# Patient Record
Sex: Female | Born: 1980
Health system: Southern US, Community
[De-identification: ages and names within clinical notes are randomized; demographics above are authoritative.]

## PROBLEM LIST (undated history)

## (undated) ENCOUNTER — Inpatient Hospital Stay (HOSPITAL_COMMUNITY): Payer: Self-pay

## (undated) DIAGNOSIS — B999 Unspecified infectious disease: Secondary | ICD-10-CM

## (undated) DIAGNOSIS — N390 Urinary tract infection, site not specified: Secondary | ICD-10-CM

## (undated) DIAGNOSIS — O1490 Unspecified pre-eclampsia, unspecified trimester: Secondary | ICD-10-CM

## (undated) HISTORY — PX: AUGMENTATION MAMMAPLASTY: SUR837

## (undated) HISTORY — PX: BREAST SURGERY: SHX581

## (undated) HISTORY — DX: Unspecified pre-eclampsia, unspecified trimester: O14.90

---

## 2004-06-28 ENCOUNTER — Ambulatory Visit (HOSPITAL_COMMUNITY): Admission: RE | Admit: 2004-06-28 | Discharge: 2004-06-28 | Payer: Self-pay | Admitting: *Deleted

## 2004-07-06 ENCOUNTER — Ambulatory Visit: Payer: Self-pay | Admitting: Family Medicine

## 2004-07-20 ENCOUNTER — Ambulatory Visit: Payer: Self-pay | Admitting: Family Medicine

## 2004-07-21 ENCOUNTER — Inpatient Hospital Stay (HOSPITAL_COMMUNITY): Admission: AD | Admit: 2004-07-21 | Discharge: 2004-07-21 | Payer: Self-pay | Admitting: *Deleted

## 2004-07-21 ENCOUNTER — Ambulatory Visit: Payer: Self-pay | Admitting: Certified Nurse Midwife

## 2004-07-26 ENCOUNTER — Ambulatory Visit: Payer: Self-pay | Admitting: *Deleted

## 2004-08-10 ENCOUNTER — Ambulatory Visit: Payer: Self-pay | Admitting: *Deleted

## 2004-08-24 ENCOUNTER — Ambulatory Visit: Payer: Self-pay | Admitting: *Deleted

## 2004-08-31 ENCOUNTER — Ambulatory Visit: Payer: Self-pay | Admitting: Family Medicine

## 2004-09-07 ENCOUNTER — Ambulatory Visit: Payer: Self-pay | Admitting: Family Medicine

## 2004-09-16 ENCOUNTER — Inpatient Hospital Stay (HOSPITAL_COMMUNITY): Admission: AD | Admit: 2004-09-16 | Discharge: 2004-09-18 | Payer: Self-pay | Admitting: Obstetrics & Gynecology

## 2004-09-16 ENCOUNTER — Ambulatory Visit: Payer: Self-pay | Admitting: Obstetrics and Gynecology

## 2005-03-09 ENCOUNTER — Emergency Department (HOSPITAL_COMMUNITY): Admission: EM | Admit: 2005-03-09 | Discharge: 2005-03-09 | Payer: Self-pay | Admitting: Family Medicine

## 2005-04-06 ENCOUNTER — Emergency Department (HOSPITAL_COMMUNITY): Admission: EM | Admit: 2005-04-06 | Discharge: 2005-04-06 | Payer: Self-pay | Admitting: Family Medicine

## 2005-04-23 ENCOUNTER — Emergency Department (HOSPITAL_COMMUNITY): Admission: EM | Admit: 2005-04-23 | Discharge: 2005-04-23 | Payer: Self-pay | Admitting: Family Medicine

## 2006-01-03 ENCOUNTER — Emergency Department (HOSPITAL_COMMUNITY): Admission: EM | Admit: 2006-01-03 | Discharge: 2006-01-04 | Payer: Self-pay | Admitting: Emergency Medicine

## 2006-02-10 ENCOUNTER — Emergency Department (HOSPITAL_COMMUNITY): Admission: EM | Admit: 2006-02-10 | Discharge: 2006-02-10 | Payer: Self-pay | Admitting: Emergency Medicine

## 2006-09-07 ENCOUNTER — Emergency Department (HOSPITAL_COMMUNITY): Admission: EM | Admit: 2006-09-07 | Discharge: 2006-09-08 | Payer: Self-pay | Admitting: Emergency Medicine

## 2006-12-10 ENCOUNTER — Encounter (INDEPENDENT_AMBULATORY_CARE_PROVIDER_SITE_OTHER): Payer: Self-pay | Admitting: *Deleted

## 2006-12-10 ENCOUNTER — Emergency Department (HOSPITAL_COMMUNITY): Admission: EM | Admit: 2006-12-10 | Discharge: 2006-12-10 | Payer: Self-pay | Admitting: Family Medicine

## 2009-02-08 ENCOUNTER — Emergency Department (HOSPITAL_COMMUNITY): Admission: EM | Admit: 2009-02-08 | Discharge: 2009-02-08 | Payer: Self-pay | Admitting: Emergency Medicine

## 2010-01-22 ENCOUNTER — Encounter (INDEPENDENT_AMBULATORY_CARE_PROVIDER_SITE_OTHER): Payer: Self-pay | Admitting: *Deleted

## 2010-01-22 ENCOUNTER — Emergency Department (HOSPITAL_COMMUNITY): Admission: EM | Admit: 2010-01-22 | Discharge: 2010-01-22 | Payer: Self-pay | Admitting: Emergency Medicine

## 2010-03-14 ENCOUNTER — Encounter: Payer: Self-pay | Admitting: Internal Medicine

## 2010-03-28 DIAGNOSIS — N83209 Unspecified ovarian cyst, unspecified side: Secondary | ICD-10-CM | POA: Insufficient documentation

## 2010-03-28 DIAGNOSIS — N946 Dysmenorrhea, unspecified: Secondary | ICD-10-CM | POA: Insufficient documentation

## 2010-03-28 DIAGNOSIS — D649 Anemia, unspecified: Secondary | ICD-10-CM

## 2010-03-28 DIAGNOSIS — K7689 Other specified diseases of liver: Secondary | ICD-10-CM | POA: Insufficient documentation

## 2010-03-31 ENCOUNTER — Ambulatory Visit: Payer: Self-pay | Admitting: Internal Medicine

## 2010-05-04 NOTE — Letter (Signed)
Summary: New Patient letter  [facility]  [address] [place], [place] [address]   Phone: [phone]  Fax: [phone]       03/14/2010 MRN: [id number]  [facility] [address] [place], [place]  [address]  Dear Ms. BUSH,  Welcome to the Gastroenterology Division at [facility].    You are scheduled to see Dr.  Leone Payor on 03-31-10 at 2:45pm on the 3rd floor at [facility], [address].  We ask that you try to arrive at our office 15 minutes prior to your appointment time to allow for check-in.  We would like you to complete the enclosed self-administered evaluation form prior to your visit and bring it with you on the day of your appointment.  We will review it with you.  Also, please bring a complete list of all your medications or, if you prefer, bring the medication bottles and we will list them.  Please bring your insurance card so that we may make a copy of it.  If your insurance requires a referral to see a specialist, please bring your referral form from your primary care physician.  Co-payments are due at the time of your visit and may be paid by cash, check or credit card.     Your office visit will consist of a consult with your physician (includes a physical exam), any laboratory testing he/she may order, scheduling of any necessary diagnostic testing (e.g. x-ray, ultrasound, CT-scan), and scheduling of a procedure (e.g. Endoscopy, Colonoscopy) if required.  Please allow enough time on your schedule to allow for any/all of these possibilities.    If you cannot keep your appointment, please call [phone] to cancel or reschedule prior to your appointment date.  This allows [place] the opportunity to schedule an appointment for another patient in need of care.  If you do not cancel or reschedule by 5 p.m. the business day prior to your appointment date, you will be charged a $50.00 late cancellation/no-show fee.    Thank you for choosing  [facility] for your medical needs.  We appreciate the opportunity to care for you.  Please visit [place] at our website  to learn more about our practice.                     Sincerely,                                                             The Gastroenterology Division

## 2010-06-01 HISTORY — PX: BREAST ENHANCEMENT SURGERY: SHX7

## 2010-06-14 LAB — COMPREHENSIVE METABOLIC PANEL
BUN: 8 mg/dL (ref 6–23)
Calcium: 9.4 mg/dL (ref 8.4–10.5)
Glucose, Bld: 89 mg/dL (ref 70–99)
Total Protein: 7.9 g/dL (ref 6.0–8.3)

## 2010-06-14 LAB — DIFFERENTIAL
Lymphocytes Relative: 28 % (ref 12–46)
Lymphs Abs: 1.8 10*3/uL (ref 0.7–4.0)
Monocytes Relative: 5 % (ref 3–12)
Neutro Abs: 4.3 10*3/uL (ref 1.7–7.7)
Neutrophils Relative %: 65 % (ref 43–77)

## 2010-06-14 LAB — CBC
HCT: 39 % (ref 36.0–46.0)
MCHC: 34.1 g/dL (ref 30.0–36.0)
MCV: 81.6 fL (ref 78.0–100.0)
RDW: 15 % (ref 11.5–15.5)

## 2010-06-14 LAB — URINALYSIS, ROUTINE W REFLEX MICROSCOPIC
Glucose, UA: NEGATIVE mg/dL
Ketones, ur: NEGATIVE mg/dL
Protein, ur: NEGATIVE mg/dL
Urobilinogen, UA: 0.2 mg/dL (ref 0.0–1.0)

## 2010-07-05 LAB — POCT PREGNANCY, URINE: Preg Test, Ur: NEGATIVE

## 2010-07-05 LAB — D-DIMER, QUANTITATIVE: D-Dimer, Quant: 1.56 ug/mL-FEU — ABNORMAL HIGH (ref 0.00–0.48)

## 2010-08-26 ENCOUNTER — Inpatient Hospital Stay (INDEPENDENT_AMBULATORY_CARE_PROVIDER_SITE_OTHER)
Admission: RE | Admit: 2010-08-26 | Discharge: 2010-08-26 | Disposition: A | Discharge status: Home / Self Care | Payer: Self-pay | Source: Ambulatory Visit | Attending: Family Medicine | Admitting: Family Medicine

## 2010-08-26 DIAGNOSIS — Z0489 Encounter for examination and observation for other specified reasons: Secondary | ICD-10-CM

## 2011-01-12 LAB — POCT URINALYSIS DIP (DEVICE)
Operator id: 235561
Protein, ur: NEGATIVE
Specific Gravity, Urine: 1.015
Urobilinogen, UA: 1

## 2011-01-12 LAB — POCT PREGNANCY, URINE: Operator id: 235561

## 2011-01-18 LAB — DIFFERENTIAL
Basophils Absolute: 0
Eosinophils Absolute: 0.2
Eosinophils Relative: 2
Lymphocytes Relative: 36
Lymphs Abs: 2.7
Monocytes Absolute: 0.3

## 2011-01-18 LAB — URINALYSIS, ROUTINE W REFLEX MICROSCOPIC
Ketones, ur: 40 — AB
Nitrite: NEGATIVE
pH: 6

## 2011-01-18 LAB — HEPATIC FUNCTION PANEL
AST: 19
Albumin: 3 — ABNORMAL LOW
Total Bilirubin: 1.4 — ABNORMAL HIGH
Total Protein: 5.8 — ABNORMAL LOW

## 2011-01-18 LAB — BASIC METABOLIC PANEL
BUN: 4 — ABNORMAL LOW
Chloride: 104
GFR calc non Af Amer: >60
Glucose, Bld: 87
Potassium: 3.2 — ABNORMAL LOW
Sodium: 136

## 2011-01-18 LAB — CBC
HCT: 34.8 — ABNORMAL LOW
Hemoglobin: 12.2
MCV: 83.2
RDW: 13.7

## 2011-01-18 LAB — URINE MICROSCOPIC-ADD ON

## 2011-01-18 LAB — HCG, QUANTITATIVE, PREGNANCY: hCG, Beta Chain, Quant, S: 47663 — ABNORMAL HIGH

## 2011-01-18 LAB — PREGNANCY, URINE: Preg Test, Ur: POSITIVE

## 2011-02-11 ENCOUNTER — Encounter: Payer: Self-pay | Admitting: Emergency Medicine

## 2011-02-11 ENCOUNTER — Emergency Department (HOSPITAL_COMMUNITY)
Admission: EM | Admit: 2011-02-11 | Discharge: 2011-02-11 | Discharge status: Against Medical Advice | Payer: Medicaid Other | Attending: Emergency Medicine | Admitting: Emergency Medicine

## 2011-02-11 DIAGNOSIS — R0602 Shortness of breath: Secondary | ICD-10-CM | POA: Insufficient documentation

## 2011-02-11 NOTE — ED Notes (Signed)
Pt reports intermittant periods of shortness of breath since Wednesday. Pt also reports abdominal pain onset last night. Pt has a child at home that has a virus.

## 2011-02-13 ENCOUNTER — Inpatient Hospital Stay (HOSPITAL_COMMUNITY)
Admission: AD | Admit: 2011-02-13 | Discharge: 2011-02-13 | Disposition: A | Discharge status: Home / Self Care | Payer: Medicaid Other | Source: Ambulatory Visit | Attending: Obstetrics & Gynecology | Admitting: Obstetrics & Gynecology

## 2011-02-13 ENCOUNTER — Encounter (HOSPITAL_COMMUNITY): Payer: Self-pay

## 2011-02-13 DIAGNOSIS — O99891 Other specified diseases and conditions complicating pregnancy: Secondary | ICD-10-CM | POA: Insufficient documentation

## 2011-02-13 DIAGNOSIS — K219 Gastro-esophageal reflux disease without esophagitis: Secondary | ICD-10-CM | POA: Insufficient documentation

## 2011-02-13 DIAGNOSIS — R109 Unspecified abdominal pain: Secondary | ICD-10-CM | POA: Insufficient documentation

## 2011-02-13 DIAGNOSIS — B373 Candidiasis of vulva and vagina: Secondary | ICD-10-CM

## 2011-02-13 LAB — URINALYSIS, ROUTINE W REFLEX MICROSCOPIC
Bilirubin Urine: NEGATIVE
Glucose, UA: NEGATIVE mg/dL
Ketones, ur: NEGATIVE mg/dL
Protein, ur: NEGATIVE mg/dL
pH: 6 (ref 5.0–8.0)

## 2011-02-13 LAB — URINE MICROSCOPIC-ADD ON

## 2011-02-13 MED ORDER — RANITIDINE HCL 150 MG PO TABS: 150.0000 mg | ORAL_TABLET | Freq: Two times a day (BID) | ORAL | Status: DC

## 2011-02-13 MED ORDER — FLUCONAZOLE 150 MG PO TABS: 150.0000 mg | ORAL_TABLET | Freq: Once | ORAL | Status: AC

## 2011-02-13 MED ORDER — GI COCKTAIL ~~LOC~~
30.0000 mL | Freq: Once | ORAL | Status: AC
Start: 2011-02-13 — End: 2011-02-13
  Administered 2011-02-13: 30 mL via ORAL
  Filled 2011-02-13: qty 30

## 2011-02-13 NOTE — Progress Notes (Signed)
Patient states she has not been feeling well for a while. Has had nausea and vomiting. Has tightness in chest periodically the causes shortness of breath. Slight vaginal discharge with irritation.

## 2011-02-13 NOTE — ED Provider Notes (Signed)
History   Pt presents today c/o lower abd pain along with N&V and heartburn. She states the heartburn causes her to have SOB and chest pain. She denies any sx at this time. She denies vag bleeding or any other sx.  Chief Complaint  Patient presents with  . Emesis   HPI  OB History    Grav Para Term Preterm Abortions TAB SAB Ect Mult Living   4 3  3      3       Past Medical History  Diagnosis Date  . No pertinent past medical history     Past Surgical History  Procedure Date  . Augmentation mammaplasty     No family history on file.  History  Substance Use Topics  . Smoking status: Never Smoker   . Smokeless tobacco: Not on file  . Alcohol Use: No    Allergies: No Known Allergies  Prescriptions prior to admission  Medication Sig Dispense Refill  . acetaminophen (TYLENOL) 500 MG tablet Take 1,000 mg by mouth every 6 (six) hours as needed. For pain       . magnesium hydroxide (MILK OF MAGNESIA) 400 MG/5ML suspension Take 15 mLs by mouth daily as needed. Stomach acid       . Prenatal Vit-Fe Fumarate-FA (MULTIVITAMIN-PRENATAL) 27-0.8 MG TABS Take 1 tablet by mouth daily.          Review of Systems  Constitutional: Negative for fever.  Respiratory: Negative for cough.   Cardiovascular: Positive for chest pain. Negative for palpitations.  Gastrointestinal: Positive for heartburn, nausea, vomiting and abdominal pain. Negative for diarrhea and constipation.  Genitourinary: Negative for dysuria, urgency, frequency and hematuria.  Neurological: Negative for dizziness and headaches.  Psychiatric/Behavioral: Negative for depression and suicidal ideas.   Physical Exam   Blood pressure 120/69, pulse 89, temperature 98.3 F (36.8 C), temperature source Oral, resp. rate 16, height 6' (1.829 m), weight 145 lb 6.4 oz (65.953 kg), last menstrual period 12/10/2010, SpO2 99.00%.  Physical Exam  Nursing note and vitals reviewed. Constitutional: She is oriented to person, place,  and time. She appears well-developed and well-nourished. No distress.  HENT:  Head: Normocephalic and atraumatic.  Eyes: EOM are normal. Pupils are equal, round, and reactive to light.  Cardiovascular: Normal rate, regular rhythm and normal heart sounds.  Exam reveals no gallop and no friction rub.   No murmur heard. Respiratory: Effort normal and breath sounds normal. No respiratory distress. She has no wheezes. She has no rales. She exhibits no tenderness.  GI: Soft. She exhibits no distension. There is no tenderness. There is no rebound and no guarding.  Genitourinary: No bleeding around the vagina. Vaginal discharge found.       Thick greenish vag dc present consistent with yeast. Cervix Lg/closed.  Neurological: She is alert and oriented to person, place, and time.  Skin: Skin is warm and dry. She is not diaphoretic.  Psychiatric: She has a normal mood and affect. Her behavior is normal. Judgment and thought content normal.    MAU Course  Procedures  Bedside US shows single IUP with cardiac activity.  Results for orders placed during the hospital encounter of 02/13/11 (from the past 24 hour(s))  URINALYSIS, ROUTINE W REFLEX MICROSCOPIC     Status: Abnormal   Collection Time   02/13/11  3:21 PM      Component Value Range   Color, Urine YELLOW  YELLOW    Appearance CLEAR  CLEAR    Specific Gravity, Urine  1.025  1.005 - 1.030    pH 6.0  5.0 - 8.0    Glucose, UA NEGATIVE  NEGATIVE (mg/dL)   Hgb urine dipstick SMALL (*) NEGATIVE    Bilirubin Urine NEGATIVE  NEGATIVE    Ketones, ur NEGATIVE  NEGATIVE (mg/dL)   Protein, ur NEGATIVE  NEGATIVE (mg/dL)   Urobilinogen, UA 0.2  0.0 - 1.0 (mg/dL)   Nitrite NEGATIVE  NEGATIVE    Leukocytes, UA TRACE (*) NEGATIVE   URINE MICROSCOPIC-ADD ON     Status: Abnormal   Collection Time   02/13/11  3:21 PM      Component Value Range   Squamous Epithelial / LPF FEW (*) RARE    WBC, UA 0-2  <3 (WBC/hpf)   Bacteria, UA FEW (*) RARE     Urine-Other MUCOUS PRESENT    POCT PREGNANCY, URINE     Status: Normal   Collection Time   02/13/11  3:27 PM      Component Value Range   Preg Test, Ur POSITIVE      Pt sx resolved following GI cocktail. Assessment and Plan  GERD: discussed with pt at length. Will give Rx for Zantac.  Abd pain: pt with single IUP. She will f/u with Dr. Tamela Oddi.  Clinton Gallant. Julius Boniface III, DrHSc, MPAS, PA-C  02/13/2011, 5:29 PM   Henrietta Hoover, PA 02/13/11 1736

## 2011-03-20 LAB — OB RESULTS CONSOLE HIV ANTIBODY (ROUTINE TESTING): HIV: NONREACTIVE

## 2011-03-20 LAB — OB RESULTS CONSOLE HEPATITIS B SURFACE ANTIGEN: Hepatitis B Surface Ag: NEGATIVE

## 2011-03-20 LAB — OB RESULTS CONSOLE RPR: RPR: NONREACTIVE

## 2011-03-20 LAB — OB RESULTS CONSOLE ABO/RH: RH Type: POSITIVE

## 2011-04-03 NOTE — L&D Delivery Note (Signed)
Delivery Note At 9:46 PM a viable female was delivered via  (Presentation: vertex ;  ).  APGAR: 9-10 , ; weight .   Placenta status: intact , .  Cord: 3 vessel  with the following complications: none .  Cord pH: none  Anesthesia: Epidural  Episiotomy: none Lacerations: none  Suture Repair: none Est. Blood Loss (mL): 350  Mom to postpartum.  Baby to nursery-stable.  Allison Deshotels A 08/31/2011, 10:01 PM

## 2011-06-02 ENCOUNTER — Inpatient Hospital Stay (HOSPITAL_COMMUNITY)
Admission: AD | Admit: 2011-06-02 | Discharge: 2011-06-03 | Disposition: A | Discharge status: Home / Self Care | Payer: Medicaid Other | Source: Ambulatory Visit | Attending: Obstetrics & Gynecology | Admitting: Obstetrics & Gynecology

## 2011-06-02 ENCOUNTER — Encounter (HOSPITAL_COMMUNITY): Payer: Self-pay | Admitting: *Deleted

## 2011-06-02 DIAGNOSIS — N949 Unspecified condition associated with female genital organs and menstrual cycle: Secondary | ICD-10-CM

## 2011-06-02 DIAGNOSIS — O99891 Other specified diseases and conditions complicating pregnancy: Secondary | ICD-10-CM | POA: Insufficient documentation

## 2011-06-02 DIAGNOSIS — R109 Unspecified abdominal pain: Secondary | ICD-10-CM | POA: Insufficient documentation

## 2011-06-02 LAB — URINALYSIS, ROUTINE W REFLEX MICROSCOPIC
Leukocytes, UA: NEGATIVE
Nitrite: NEGATIVE
Protein, ur: NEGATIVE mg/dL
Urobilinogen, UA: 0.2 mg/dL (ref 0.0–1.0)

## 2011-06-02 NOTE — ED Notes (Signed)
Spoke with Domenick Bookbinder RN, MSN, [facility] about pt's concern regarding hx MRSA on chart.Norwood Levo will come and talk with patient

## 2011-06-02 NOTE — Progress Notes (Signed)
Pt states, " This all started with cramping in my right butt cheek about three hours ago and it moved into my rectal area. It feels like my right leg is numb in my thigh. About an hour and half after the butt pain I started having low abdominal cramping,"

## 2011-06-02 NOTE — ED Notes (Signed)
Threasa Heads CNM at bedside.

## 2011-06-02 NOTE — ED Notes (Signed)
Pt very upset that chart shows pt hx MRSA and pt unaware. Explained must be isolated when hx MRSA on chart and coming into MAU

## 2011-06-02 NOTE — ED Provider Notes (Signed)
History     Chief Complaint  Patient presents with  . Abdominal Cramping   HPI  Pt states, " This all started with cramping in my right butt cheek about three hours ago and it moved into my rectal area. It feels like my right leg is numb in my thigh. About an hour and half after the butt pain I started having low abdominal cramping,"  Denies vaginal bleeding or leaking of fluid.     Past Medical History  Diagnosis Date  . No pertinent past medical history     Past Surgical History  Procedure Date  . Augmentation mammaplasty   . Breast enhancement surgery 06/2010    Family History  Problem Relation Age of Onset  . Anesthesia problems Neg Hx   . Hypotension Neg Hx   . Malignant hyperthermia Neg Hx   . Pseudochol deficiency Neg Hx     History  Substance Use Topics  . Smoking status: Never Smoker   . Smokeless tobacco: Not on file  . Alcohol Use: No    Allergies: No Known Allergies  Prescriptions prior to admission  Medication Sig Dispense Refill  . butalbital-acetaminophen-caffeine (FIORICET) 50-325-40 MG per tablet Take 1 tablet by mouth 2 (two) times daily as needed.      . Prenatal Vit-Fe Fumarate-FA (MULTIVITAMIN-PRENATAL) 27-0.8 MG TABS Take 1 tablet by mouth daily.        . ranitidine (ZANTAC) 150 MG tablet Take 1 tablet (150 mg total) by mouth 2 (two) times daily.  60 tablet  6    Review of Systems  Genitourinary:       Vaginal pain  Musculoskeletal:       Right buttocks pain  All other systems reviewed and are negative.   Physical Exam   Blood pressure 103/60, pulse 78, temperature 97 F (36.1 C), temperature source Oral, resp. rate 20, height 6\' 1"  (1.854 m), weight 73.029 kg (161 lb), last menstrual period 12/10/2010.  Physical Exam  Constitutional: She is oriented to person, place, and time. She appears well-developed and well-nourished. No distress.  HENT:  Head: Normocephalic.  Neck: Normal range of motion. Neck supple.  Cardiovascular: Normal  rate, regular rhythm and normal heart sounds.   Respiratory: Effort normal and breath sounds normal.  GI: Soft. There is no tenderness.  Genitourinary: No bleeding around the vagina. Vaginal discharge (mucusy) found.       Cervix - closed  Neurological: She is alert and oriented to person, place, and time.  Skin: Skin is warm and dry.   FHR 130's Toco - irritability (no contractions felt by pt) MAU Course  Procedures  Results for orders placed during the hospital encounter of 06/02/11 (from the past 24 hour(s))  URINALYSIS, ROUTINE W REFLEX MICROSCOPIC     Status: Normal   Collection Time   06/02/11 11:07 PM      Component Value Range   Color, Urine YELLOW  YELLOW    APPearance CLEAR  CLEAR    Specific Gravity, Urine 1.020  1.005 - 1.030    pH 6.5  5.0 - 8.0    Glucose, UA NEGATIVE  NEGATIVE (mg/dL)   Hgb urine dipstick NEGATIVE  NEGATIVE    Bilirubin Urine NEGATIVE  NEGATIVE    Ketones, ur NEGATIVE  NEGATIVE (mg/dL)   Protein, ur NEGATIVE  NEGATIVE (mg/dL)   Urobilinogen, UA 0.2  0.0 - 1.0 (mg/dL)   Nitrite NEGATIVE  NEGATIVE    Leukocytes, UA NEGATIVE  NEGATIVE  Assessment and Plan  Round Ligament Pain  Plan: DC to home Reviewed preterm labor Keep scheduled appointment  [facility] 06/02/2011, 11:35 PM

## 2011-06-03 NOTE — ED Notes (Signed)
Lab called and unable to obtain any lab results to indicate pos MRSA

## 2011-06-03 NOTE — ED Notes (Signed)
Domenick Bookbinder RN MSN [facility] in to talk with pt regarding MRSA

## 2011-06-08 ENCOUNTER — Other Ambulatory Visit: Payer: Self-pay | Admitting: Obstetrics & Gynecology

## 2011-06-08 DIAGNOSIS — Z0489 Encounter for examination and observation for other specified reasons: Secondary | ICD-10-CM

## 2011-06-14 ENCOUNTER — Ambulatory Visit (HOSPITAL_COMMUNITY)
Admission: RE | Admit: 2011-06-14 | Discharge: 2011-06-14 | Disposition: A | Discharge status: Home / Self Care | Payer: Medicaid Other | Source: Ambulatory Visit | Attending: Obstetrics & Gynecology | Admitting: Obstetrics & Gynecology

## 2011-06-14 DIAGNOSIS — Z8751 Personal history of pre-term labor: Secondary | ICD-10-CM | POA: Insufficient documentation

## 2011-06-14 DIAGNOSIS — Z1389 Encounter for screening for other disorder: Secondary | ICD-10-CM | POA: Insufficient documentation

## 2011-06-14 DIAGNOSIS — Z0489 Encounter for examination and observation for other specified reasons: Secondary | ICD-10-CM

## 2011-06-14 DIAGNOSIS — O358XX0 Maternal care for other (suspected) fetal abnormality and damage, not applicable or unspecified: Secondary | ICD-10-CM | POA: Insufficient documentation

## 2011-06-14 DIAGNOSIS — Z363 Encounter for antenatal screening for malformations: Secondary | ICD-10-CM | POA: Insufficient documentation

## 2011-07-25 ENCOUNTER — Other Ambulatory Visit: Payer: Self-pay | Admitting: Obstetrics & Gynecology

## 2011-07-25 DIAGNOSIS — O4100X0 Oligohydramnios, unspecified trimester, not applicable or unspecified: Secondary | ICD-10-CM

## 2011-07-27 ENCOUNTER — Ambulatory Visit (HOSPITAL_COMMUNITY)
Admission: RE | Admit: 2011-07-27 | Discharge: 2011-07-27 | Disposition: A | Discharge status: Home / Self Care | Payer: Medicaid Other | Source: Ambulatory Visit | Attending: Obstetrics & Gynecology | Admitting: Obstetrics & Gynecology

## 2011-07-27 DIAGNOSIS — Z3689 Encounter for other specified antenatal screening: Secondary | ICD-10-CM | POA: Insufficient documentation

## 2011-07-27 DIAGNOSIS — O4100X0 Oligohydramnios, unspecified trimester, not applicable or unspecified: Secondary | ICD-10-CM

## 2011-08-03 ENCOUNTER — Encounter (HOSPITAL_COMMUNITY): Payer: Self-pay | Admitting: *Deleted

## 2011-08-03 ENCOUNTER — Inpatient Hospital Stay (HOSPITAL_COMMUNITY)
Admission: AD | Admit: 2011-08-03 | Discharge: 2011-08-03 | Disposition: A | Discharge status: Home / Self Care | Payer: Medicaid Other | Source: Ambulatory Visit | Attending: Obstetrics & Gynecology | Admitting: Obstetrics & Gynecology

## 2011-08-03 DIAGNOSIS — O4700 False labor before 37 completed weeks of gestation, unspecified trimester: Secondary | ICD-10-CM | POA: Insufficient documentation

## 2011-08-03 HISTORY — DX: Urinary tract infection, site not specified: N39.0

## 2011-08-03 LAB — WET PREP, GENITAL: Yeast Wet Prep HPF POC: NONE SEEN

## 2011-08-03 LAB — URINALYSIS, ROUTINE W REFLEX MICROSCOPIC
Bilirubin Urine: NEGATIVE
Glucose, UA: NEGATIVE mg/dL
Hgb urine dipstick: NEGATIVE
Nitrite: NEGATIVE
Specific Gravity, Urine: 1.01 (ref 1.005–1.030)
pH: 7.5 (ref 5.0–8.0)

## 2011-08-03 LAB — FETAL FIBRONECTIN: Fetal Fibronectin: NEGATIVE

## 2011-08-03 MED ORDER — NIFEDIPINE ER OSMOTIC RELEASE 60 MG PO TB24: 60.0000 mg | ORAL_TABLET | Freq: Every day | ORAL | Status: DC

## 2011-08-03 MED ORDER — FAMOTIDINE 20 MG PO TABS
20.0000 mg | ORAL_TABLET | Freq: Once | ORAL | Status: AC
  Administered 2011-08-03: 20 mg via ORAL
  Filled 2011-08-03: qty 1

## 2011-08-03 MED ORDER — TERBUTALINE SULFATE 1 MG/ML IJ SOLN
0.2500 mg | Freq: Once | INTRAMUSCULAR | Status: AC
  Administered 2011-08-03: 0.25 mg via SUBCUTANEOUS
  Filled 2011-08-03: qty 1

## 2011-08-03 NOTE — MAU Note (Signed)
No more ctx's, pain from heartburn gone.

## 2011-08-03 NOTE — MAU Provider Note (Signed)
History     CSN: [id number]  Arrival date and time: 08/03/11 1655   First Provider Initiated Contact with Patient 08/03/11 1757      Chief Complaint  Patient presents with  . Labor Eval   HPI 31 y.o. [id number] at [redacted]w[redacted]d with contractions x 6 hours today, "every few minutes" for a couple of hours, then stopped for a while, now coming "every few minutes" again. No bleeding or LOF. H/O PTD x 2 at 34 and 35 weeks. On 17-P this pregnancy. Cervix 1 cm since 28 weeks.   Past Medical History  Diagnosis Date  . Preterm labor   . Urinary tract infection     Past Surgical History  Procedure Date  . Augmentation mammaplasty   . Breast enhancement surgery 06/2010    Family History  Problem Relation Age of Onset  . Anesthesia problems Neg Hx   . Hypotension Neg Hx   . Malignant hyperthermia Neg Hx   . Pseudochol deficiency Neg Hx   . Hearing loss Son   . Hypertension Maternal Aunt   . Hearing loss Maternal Aunt   . Hypertension Maternal Uncle   . Hypertension Maternal Grandmother   . Hypertension Maternal Grandfather     History  Substance Use Topics  . Smoking status: Former Games developer  . Smokeless tobacco: Never Used   Comment: 2010  . Alcohol Use: No    Allergies: No Known Allergies  Prescriptions prior to admission  Medication Sig Dispense Refill  . butalbital-acetaminophen-caffeine (FIORICET) 50-325-40 MG per tablet Take 1 tablet by mouth 2 (two) times daily as needed. headaches      . calcium carbonate (OS-CAL) 600 MG TABS Take 600 mg by mouth daily.      . Prenatal Vit-Fe Fumarate-FA (MULTIVITAMIN-PRENATAL) 27-0.8 MG TABS Take 1 tablet by mouth daily.        . ranitidine (ZANTAC) 150 MG tablet Take 1 tablet (150 mg total) by mouth 2 (two) times daily.  60 tablet  6    Review of Systems  Constitutional: Negative.   Respiratory: Negative.   Cardiovascular: Negative.   Gastrointestinal: Negative for nausea, vomiting, abdominal pain, diarrhea and constipation.    Genitourinary: Negative for dysuria, urgency, frequency, hematuria and flank pain.       Negative for vaginal bleeding, Positive for cramping/contractions  Musculoskeletal: Negative.   Neurological: Negative.   Psychiatric/Behavioral: Negative.    Physical Exam   Blood pressure 109/71, pulse 83, temperature 98.1 F (36.7 C), temperature source Oral, resp. rate 20, last menstrual period 12/10/2010.  Physical Exam  Nursing note and vitals reviewed. Constitutional: She is oriented to person, place, and time. She appears well-developed and well-nourished. No distress.  Cardiovascular: Normal rate.   Respiratory: Effort normal.  GI: Soft. There is no tenderness.  Genitourinary: No vaginal discharge found.       SVE: 1.5/thick/high/posterior  Musculoskeletal: Normal range of motion.  Neurological: She is alert and oriented to person, place, and time.  Skin: Skin is warm and dry.  Psychiatric: She has a normal mood and affect.    MAU Course  Procedures  Results for orders placed during the hospital encounter of 08/03/11 (from the past 24 hour(s))  WET PREP, GENITAL     Status: Abnormal   Collection Time   08/03/11  6:08 PM      Component Value Range   Yeast Wet Prep HPF POC NONE SEEN  NONE SEEN    Trich, Wet Prep NONE SEEN  NONE SEEN  Clue Cells Wet Prep HPF POC NONE SEEN  NONE SEEN    WBC, Wet Prep HPF POC MODERATE (*) NONE SEEN   FETAL FIBRONECTIN     Status: Normal   Collection Time   08/03/11  6:08 PM      Component Value Range   Fetal Fibronectin NEGATIVE  NEGATIVE   URINALYSIS, ROUTINE W REFLEX MICROSCOPIC     Status: Normal   Collection Time   08/03/11  6:46 PM      Component Value Range   Color, Urine YELLOW  YELLOW    APPearance CLEAR  CLEAR    Specific Gravity, Urine 1.010  1.005 - 1.030    pH 7.5  5.0 - 8.0    Glucose, UA NEGATIVE  NEGATIVE (mg/dL)   Hgb urine dipstick NEGATIVE  NEGATIVE    Bilirubin Urine NEGATIVE  NEGATIVE    Ketones, ur NEGATIVE  NEGATIVE  (mg/dL)   Protein, ur NEGATIVE  NEGATIVE (mg/dL)   Urobilinogen, UA 0.2  0.0 - 1.0 (mg/dL)   Nitrite NEGATIVE  NEGATIVE    Leukocytes, UA NEGATIVE  NEGATIVE      Assessment and Plan  31 y.o. [id number] at [redacted]w[redacted]d Threatened preterm labor Contractions resolved with Terb 0.25 mg [place] Rx Procardia XL 60 mg PO daily F/U in office   Sanoe Hazan 08/03/2011, 6:06 PM

## 2011-08-03 NOTE — MAU Note (Signed)
consistant pains since noon, asking about medication to stop contractions. Has hx of preterm deliveries.

## 2011-08-09 ENCOUNTER — Other Ambulatory Visit: Payer: Self-pay | Admitting: Obstetrics & Gynecology

## 2011-08-09 DIAGNOSIS — O4100X0 Oligohydramnios, unspecified trimester, not applicable or unspecified: Secondary | ICD-10-CM

## 2011-08-10 ENCOUNTER — Ambulatory Visit (HOSPITAL_COMMUNITY)
Admission: RE | Admit: 2011-08-10 | Discharge: 2011-08-10 | Disposition: A | Discharge status: Home / Self Care | Payer: Medicaid Other | Source: Ambulatory Visit | Attending: Obstetrics & Gynecology | Admitting: Obstetrics & Gynecology

## 2011-08-10 DIAGNOSIS — O4100X0 Oligohydramnios, unspecified trimester, not applicable or unspecified: Secondary | ICD-10-CM | POA: Insufficient documentation

## 2011-08-10 DIAGNOSIS — Z3689 Encounter for other specified antenatal screening: Secondary | ICD-10-CM | POA: Insufficient documentation

## 2011-08-16 ENCOUNTER — Other Ambulatory Visit: Payer: Self-pay | Admitting: Obstetrics & Gynecology

## 2011-08-16 DIAGNOSIS — O288 Other abnormal findings on antenatal screening of mother: Secondary | ICD-10-CM

## 2011-08-17 ENCOUNTER — Other Ambulatory Visit: Payer: Self-pay | Admitting: Obstetrics & Gynecology

## 2011-08-17 ENCOUNTER — Ambulatory Visit (HOSPITAL_COMMUNITY)
Admission: RE | Admit: 2011-08-17 | Discharge: 2011-08-17 | Disposition: A | Discharge status: Home / Self Care | Payer: Medicaid Other | Source: Ambulatory Visit | Attending: Obstetrics & Gynecology | Admitting: Obstetrics & Gynecology

## 2011-08-17 DIAGNOSIS — O288 Other abnormal findings on antenatal screening of mother: Secondary | ICD-10-CM

## 2011-08-17 DIAGNOSIS — Z8751 Personal history of pre-term labor: Secondary | ICD-10-CM | POA: Insufficient documentation

## 2011-08-17 DIAGNOSIS — O4100X0 Oligohydramnios, unspecified trimester, not applicable or unspecified: Secondary | ICD-10-CM | POA: Insufficient documentation

## 2011-08-26 ENCOUNTER — Inpatient Hospital Stay (HOSPITAL_COMMUNITY)
Admission: AD | Admit: 2011-08-26 | Discharge: 2011-08-26 | Disposition: A | Discharge status: Home / Self Care | Payer: Medicaid Other | Source: Ambulatory Visit | Attending: Obstetrics & Gynecology | Admitting: Obstetrics & Gynecology

## 2011-08-26 DIAGNOSIS — O479 False labor, unspecified: Secondary | ICD-10-CM | POA: Insufficient documentation

## 2011-08-26 NOTE — MAU Note (Signed)
"  I've been contracting since last night about every 10 minutes.  They are more intense since this morning, so I just wanted to come get checked out.  (+) FM, no bleeding or leaking of fluid."

## 2011-08-31 ENCOUNTER — Encounter (HOSPITAL_COMMUNITY): Payer: Self-pay

## 2011-08-31 ENCOUNTER — Encounter (HOSPITAL_COMMUNITY): Payer: Self-pay | Admitting: Anesthesiology

## 2011-08-31 ENCOUNTER — Inpatient Hospital Stay (HOSPITAL_COMMUNITY)
Admission: AD | Admit: 2011-08-31 | Discharge: 2011-09-02 | DRG: 775 | Disposition: A | Discharge status: Home / Self Care | Payer: Medicaid Other | Source: Ambulatory Visit | Attending: Obstetrics | Admitting: Obstetrics

## 2011-08-31 ENCOUNTER — Inpatient Hospital Stay (HOSPITAL_COMMUNITY): Payer: Medicaid Other | Admitting: Anesthesiology

## 2011-08-31 ENCOUNTER — Encounter (HOSPITAL_COMMUNITY): Payer: Self-pay | Admitting: *Deleted

## 2011-08-31 LAB — CBC
HCT: 34.5 % — ABNORMAL LOW (ref 36.0–46.0)
Hemoglobin: 12 g/dL (ref 12.0–15.0)
MCV: 78.8 fL (ref 78.0–100.0)
RBC: 4.38 MIL/uL (ref 3.87–5.11)
WBC: 9.4 10*3/uL (ref 4.0–10.5)

## 2011-08-31 MED ORDER — IBUPROFEN 600 MG PO TABS: 600.0000 mg | ORAL_TABLET | Freq: Four times a day (QID) | ORAL | Status: DC | PRN

## 2011-08-31 MED ORDER — LACTATED RINGERS IV SOLN
500.0000 mL | Freq: Once | INTRAVENOUS | Status: AC
  Administered 2011-08-31: 500 mL via INTRAVENOUS

## 2011-08-31 MED ORDER — OXYTOCIN 20 UNITS IN LACTATED RINGERS INFUSION - SIMPLE
1.0000 m[IU]/min | INTRAVENOUS | Status: DC
  Administered 2011-08-31: 1 m[IU]/min via INTRAVENOUS
  Filled 2011-08-31: qty 1000

## 2011-08-31 MED ORDER — ONDANSETRON HCL 4 MG/2ML IJ SOLN: 4.0000 mg | Freq: Four times a day (QID) | INTRAMUSCULAR | Status: DC | PRN

## 2011-08-31 MED ORDER — OXYCODONE-ACETAMINOPHEN 5-325 MG PO TABS: 1.0000 | ORAL_TABLET | ORAL | Status: DC | PRN

## 2011-08-31 MED ORDER — EPHEDRINE 5 MG/ML INJ
10.0000 mg | INTRAVENOUS | Status: DC | PRN
  Filled 2011-08-31: qty 2

## 2011-08-31 MED ORDER — LIDOCAINE HCL (PF) 1 % IJ SOLN
30.0000 mL | INTRAMUSCULAR | Status: DC | PRN
  Filled 2011-08-31 (×2): qty 30

## 2011-08-31 MED ORDER — OXYCODONE-ACETAMINOPHEN 5-325 MG PO TABS
2.0000 | ORAL_TABLET | Freq: Once | ORAL | Status: AC
  Administered 2011-08-31: 2 via ORAL
  Filled 2011-08-31: qty 2

## 2011-08-31 MED ORDER — LACTATED RINGERS IV SOLN: 500.0000 mL | INTRAVENOUS | Status: DC | PRN

## 2011-08-31 MED ORDER — ACETAMINOPHEN 325 MG PO TABS: 650.0000 mg | ORAL_TABLET | ORAL | Status: DC | PRN

## 2011-08-31 MED ORDER — OXYTOCIN 10 UNIT/ML IJ SOLN
40.0000 [IU] | Freq: Once | INTRAVENOUS | Status: AC
  Administered 2011-08-31: 40 [IU] via INTRAVENOUS
  Filled 2011-08-31: qty 4

## 2011-08-31 MED ORDER — FLEET ENEMA 7-19 GM/118ML RE ENEM: 1.0000 | ENEMA | RECTAL | Status: DC | PRN

## 2011-08-31 MED ORDER — LACTATED RINGERS IV SOLN
INTRAVENOUS | Status: DC
  Administered 2011-08-31: 125 mL/h via INTRAVENOUS
  Administered 2011-08-31: 17:00:00 via INTRAVENOUS

## 2011-08-31 MED ORDER — EPHEDRINE 5 MG/ML INJ
10.0000 mg | INTRAVENOUS | Status: DC | PRN
  Filled 2011-08-31: qty 4
  Filled 2011-08-31: qty 2

## 2011-08-31 MED ORDER — OXYTOCIN BOLUS FROM INFUSION
500.0000 mL | Freq: Once | INTRAVENOUS | Status: DC
  Filled 2011-08-31: qty 500

## 2011-08-31 MED ORDER — FENTANYL 2.5 MCG/ML BUPIVACAINE 1/10 % EPIDURAL INFUSION (WH - ANES)
INTRAMUSCULAR | Status: DC | PRN
  Administered 2011-08-31: 14 mL/h via EPIDURAL

## 2011-08-31 MED ORDER — CITRIC ACID-SODIUM CITRATE 334-500 MG/5ML PO SOLN: 30.0000 mL | ORAL | Status: DC | PRN

## 2011-08-31 MED ORDER — PHENYLEPHRINE 40 MCG/ML (10ML) SYRINGE FOR IV PUSH (FOR BLOOD PRESSURE SUPPORT)
80.0000 ug | PREFILLED_SYRINGE | INTRAVENOUS | Status: DC | PRN
  Filled 2011-08-31: qty 2

## 2011-08-31 MED ORDER — DIPHENHYDRAMINE HCL 50 MG/ML IJ SOLN
12.5000 mg | INTRAMUSCULAR | Status: DC | PRN
  Administered 2011-08-31: 12.5 mg via INTRAVENOUS
  Filled 2011-08-31: qty 1

## 2011-08-31 MED ORDER — FENTANYL 2.5 MCG/ML BUPIVACAINE 1/10 % EPIDURAL INFUSION (WH - ANES)
14.0000 mL/h | INTRAMUSCULAR | Status: DC
  Administered 2011-08-31: 14 mL/h via EPIDURAL
  Filled 2011-08-31 (×2): qty 60

## 2011-08-31 MED ORDER — TERBUTALINE SULFATE 1 MG/ML IJ SOLN: 0.2500 mg | Freq: Once | INTRAMUSCULAR | Status: DC | PRN

## 2011-08-31 MED ORDER — OXYTOCIN 20 UNITS IN LACTATED RINGERS INFUSION - SIMPLE
125.0000 mL/h | Freq: Once | INTRAVENOUS | Status: AC
  Administered 2011-08-31: 125 mL/h via INTRAVENOUS

## 2011-08-31 MED ORDER — LIDOCAINE HCL (PF) 1 % IJ SOLN
INTRAMUSCULAR | Status: DC | PRN
  Administered 2011-08-31 (×2): 8 mL

## 2011-08-31 MED ORDER — PHENYLEPHRINE 40 MCG/ML (10ML) SYRINGE FOR IV PUSH (FOR BLOOD PRESSURE SUPPORT)
80.0000 ug | PREFILLED_SYRINGE | INTRAVENOUS | Status: DC | PRN
  Filled 2011-08-31: qty 5
  Filled 2011-08-31: qty 2

## 2011-08-31 NOTE — H&P (Signed)
Michelle [name] is a 31 y.o. female presenting for UC's. Maternal Medical History:  Reason for admission: Reason for admission: contractions.  Contractions: Onset was 6-12 hours ago.    Fetal activity: Perceived fetal activity is normal.   Last perceived fetal movement was within the past hour.    Prenatal complications: no prenatal complications Prenatal Complications - Diabetes: none.    OB History    Grav Para Term Preterm Abortions TAB SAB Ect Mult Living   4 3 1 2      3      Past Medical History  Diagnosis Date  . Preterm labor   . Urinary tract infection    Past Surgical History  Procedure Date  . Augmentation mammaplasty   . Breast enhancement surgery 06/2010   Family History: family history includes Hearing loss in her maternal aunt and son and Hypertension in her maternal aunt, maternal grandfather, maternal grandmother, and maternal uncle.  There is no history of Anesthesia problems, and Hypotension, and Malignant hyperthermia, and Pseudochol deficiency, . Social History:  reports that she has quit smoking. She has never used smokeless tobacco. She reports that she does not drink alcohol or use illicit drugs.  Review of Systems  All other systems reviewed and are negative.    Dilation: 5.5 Effacement (%): 80 Station: -2 Exam by:: SBeck, RN Blood pressure 107/88, pulse 81, temperature 98.3 F (36.8 C), temperature source Oral, resp. rate 16, height 6\' 1"  (1.854 m), weight 79.47 kg (175 lb 3.2 oz), last menstrual period 12/10/2010, SpO2 100.00%. Maternal Exam:  Abdomen: Patient reports no abdominal tenderness. Fetal presentation: vertex     Physical Exam  Nursing note and vitals reviewed. Constitutional: She is oriented to person, place, and time. She appears well-developed and well-nourished.  HENT:  Head: Normocephalic and atraumatic.  Eyes: Conjunctivae are normal. Pupils are equal, round, and reactive to light.  Neck: Normal range of motion. Neck  supple.  Cardiovascular: Normal rate and regular rhythm.   Respiratory: Effort normal.  GI: Soft.  Musculoskeletal: Normal range of motion.  Neurological: She is alert and oriented to person, place, and time.  Skin: Skin is warm and dry.  Psychiatric: She has a normal mood and affect. Her behavior is normal. Judgment and thought content normal.    Prenatal labs: ABO, Rh:   Antibody:   Rubella:   RPR:    HBsAg:    HIV:    GBS:     Assessment/Plan: 37 weeks.  Active labor.  Admit.   Aronda Burford A 08/31/2011, 4:15 PM

## 2011-08-31 NOTE — Anesthesia Procedure Notes (Signed)
Epidural Patient location during procedure: OB Start time: 08/31/2011 6:05 PM End time: 08/31/2011 6:12 PM Reason for block: procedure for pain  Staffing Anesthesiologist: Sandrea Hughs Performed by: anesthesiologist   Preanesthetic Checklist Completed: patient identified, site marked, surgical consent, pre-op evaluation, timeout performed, IV checked, risks and benefits discussed and monitors and equipment checked  Epidural Patient position: sitting Prep: site prepped and draped and DuraPrep Patient monitoring: continuous pulse ox and blood pressure Approach: midline Injection technique: LOR air  Needle:  Needle type: Tuohy  Needle gauge: 17 G Needle length: 9 cm Needle insertion depth: 5 cm cm Catheter type: closed end flexible Catheter size: 19 Gauge Catheter at skin depth: 10 cm Test dose: negative and Other  Assessment Sensory level: T10 Events: blood not aspirated, injection not painful, no injection resistance, negative IV test and no paresthesia

## 2011-08-31 NOTE — MAU Note (Signed)
Dr. Clearance Coots notified pt requests pain meds to go home with, cervix very posterior, 3.5/60/-1 vertex. EFM tracing reactive. Orders to d/c monitoring, give po percocet in MAU, then d/c home. Dr Clearance Coots to call in rx for vicodin to [facility] on [address].Marland Kitchen

## 2011-08-31 NOTE — Progress Notes (Signed)
Michelle [name] is a 31 y.o. [id number] at [redacted]w[redacted]d by LMP admitted for active labor  Subjective:   Objective: BP 112/66  Pulse 68  Temp(Src) 98.2 F (36.8 C) (Oral)  Resp 18  Ht 6\' 1"  (1.854 m)  Wt 79.47 kg (175 lb 3.2 oz)  BMI 23.11 kg/m2  SpO2 97%  LMP 12/10/2010      FHT:  FHR: 150 bpm, variability: moderate,  accelerations:  Present,  decelerations:  Absent UC:   regular, every 3-4 minutes SVE:   Dilation: 8 Effacement (%): 100 Station: 0 Exam by:: DuPont, RN   Labs: Lab Results  Component Value Date   WBC 9.4 08/31/2011   HGB 12.0 08/31/2011   HCT 34.5* 08/31/2011   MCV 78.8 08/31/2011   PLT 208 08/31/2011    Assessment / Plan: Spontaneous labor, progressing normally  Labor: Progressing normally Preeclampsia:  n/a Fetal Wellbeing:  Category I Pain Control:  Epidural I/D:  n/a Anticipated MOD:  NSVD  Proctor Carriker A 08/31/2011, 8:30 PM

## 2011-08-31 NOTE — Discharge Instructions (Signed)
Follow up as directed by your doctor. Your prescription for pain medication has been called in to the [facility] off [address].      Fetal Movement Michelle [name] Patient Name: __________________________________________________ Patient Due Date: ____________________ Michelle Michelle [name] is highly recommended in high risk pregnancies, but it is a good idea for every pregnant woman to do. Start counting fetal movements at 28 weeks of the pregnancy. Fetal movements increase after eating a full meal or eating or drinking something sweet (the blood sugar is higher). It is also important to drink plenty of fluids (well hydrated) before doing the count. Lie on your left side because it helps with the circulation or you can sit in a comfortable chair with your arms over your belly (abdomen) with no distractions around you. DOING THE COUNT  Try to do the count the same time of day each time you do it.   Mark the day and time, then see how long it takes for you to feel 10 movements (kicks, flutters, swishes, rolls). You should have at least 10 movements within 2 hours. You will most likely feel 10 movements in much less than 2 hours. If you do not, wait an hour and count again. After a couple of days you will see a pattern.   What you are looking for is a change in the pattern or not enough Michelle [name] in 2 hours. Is it taking longer in time to reach 10 movements?  SEEK MEDICAL CARE IF:  You feel less than 10 Michelle [name] in 2 hours. Tried twice.   No movement in one hour.   The pattern is changing or taking longer each day to reach 10 Michelle [name] in 2 hours.   You feel the baby is not moving as it usually does.  Date: ____________ Movements: ____________ Start time: ____________ Michelle Michelle [name] time: ____________  Date: ____________ Movements: ____________ Start time: ____________ Michelle Michelle [name] time: ____________ Date: ____________ Movements: ____________ Start time: ____________ Michelle Michelle [name] time: ____________ Date: ____________ Movements: ____________  Start time: ____________ Michelle Michelle [name] time: ____________ Date: ____________ Movements: ____________ Start time: ____________ Michelle Michelle [name] time: ____________ Date: ____________ Movements: ____________ Start time: ____________ Michelle Michelle [name] time: ____________ Date: ____________ Movements: ____________ Start time: ____________ Michelle Michelle [name] time: ____________ Date: ____________ Movements: ____________ Start time: ____________ Michelle Michelle [name] time: ____________  Date: ____________ Movements: ____________ Start time: ____________ Michelle Michelle [name] time: ____________ Date: ____________ Movements: ____________ Start time: ____________ Michelle Michelle [name] time: ____________ Date: ____________ Movements: ____________ Start time: ____________ Michelle Michelle [name] time: ____________ Date: ____________ Movements: ____________ Start time: ____________ Michelle Michelle [name] time: ____________ Date: ____________ Movements: ____________ Start time: ____________ Michelle Michelle [name] time: ____________ Date: ____________ Movements: ____________ Start time: ____________ Michelle Michelle [name] time: ____________ Date: ____________ Movements: ____________ Start time: ____________ Michelle Michelle [name] time: ____________  Date: ____________ Movements: ____________ Start time: ____________ Michelle Michelle [name] time: ____________ Date: ____________ Movements: ____________ Start time: ____________ Michelle Michelle [name] time: ____________ Date: ____________ Movements: ____________ Start time: ____________ Michelle Michelle [name] time: ____________ Date: ____________ Movements: ____________ Start time: ____________ Michelle Michelle [name] time: ____________ Date: ____________ Movements: ____________ Start time: ____________ Michelle Michelle [name] time: ____________ Date: ____________ Movements: ____________ Start time: ____________ Michelle Michelle [name] time: ____________ Date: ____________ Movements: ____________ Start time: ____________ Michelle Michelle [name] time: ____________  Date: ____________ Movements: ____________ Start time: ____________ Michelle Michelle [name] time: ____________ Date: ____________ Movements: ____________ Start time: ____________ Michelle Michelle [name] time:  ____________ Date: ____________ Movements: ____________ Start time: ____________ Michelle Michelle [name] time: ____________ Date: ____________ Movements: ____________ Start time: ____________ Michelle Michelle [name] time: ____________ Date: ____________ Movements: ____________ Start time: ____________ Michelle Michelle [name] time: ____________ Date: ____________ Movements: ____________ Start time: ____________ Michelle Michelle [name] time: ____________ Date: ____________ Movements: ____________ Start time: ____________ Michelle Michelle [name] time: ____________  Date: ____________ Movements: ____________ Start time: ____________ Michelle Michelle [name] time: ____________ Date: ____________ Movements: ____________ Start time: ____________ Michelle Michelle [name] time: ____________ Date: ____________ Movements: ____________ Start time: ____________ Michelle Michelle [name] time: ____________ Date: ____________ Movements: ____________ Start time: ____________ Michelle Michelle [name] time: ____________ Date: ____________ Movements: ____________ Start time: ____________ Michelle Michelle [name] time: ____________ Date: ____________ Movements: ____________ Start time: ____________ Michelle Michelle [name] time: ____________ Date: ____________ Movements: ____________ Start time: ____________ Michelle Michelle [name] time: ____________  Date: ____________ Movements: ____________ Start time: ____________ Michelle Michelle [name] time: ____________ Date: ____________ Movements: ____________ Start time: ____________ Michelle Michelle [name] time: ____________ Date: ____________ Movements: ____________ Start time: ____________ Michelle Michelle [name] time: ____________ Date: ____________ Movements: ____________ Start time: ____________ Michelle Michelle [name] time: ____________ Date: ____________ Movements: ____________ Start time: ____________ Michelle Michelle [name] time: ____________ Date: ____________ Movements: ____________ Start time: ____________ Michelle Michelle [name] time: ____________ Date: ____________ Movements: ____________ Start time: ____________ Michelle Michelle [name] time: ____________  Date: ____________ Movements: ____________ Start time: ____________ Michelle Michelle [name] time: ____________ Date: ____________ Movements:  ____________ Start time: ____________ Michelle Michelle [name] time: ____________ Date: ____________ Movements: ____________ Start time: ____________ Michelle Michelle [name] time: ____________ Date: ____________ Movements: ____________ Start time: ____________ Michelle Michelle [name] time: ____________ Date: ____________ Movements: ____________ Start time: ____________ Michelle Michelle [name] time: ____________ Date: ____________ Movements: ____________ Start time: ____________ Michelle Michelle [name] time: ____________ Date: ____________ Movements: ____________ Start time: ____________ Michelle Michelle [name] time: ____________  Date: ____________ Movements: ____________ Start time: ____________ Michelle Michelle [name] time: ____________ Date: ____________ Movements: ____________ Start time: ____________ Michelle Michelle [name] time: ____________ Date: ____________ Movements: ____________ Start time: ____________ Michelle Michelle [name] time: ____________ Date: ____________ Movements: ____________ Start time: ____________ Michelle Michelle [name] time: ____________ Date: ____________ Movements: ____________ Start time: ____________ Michelle Michelle [name] time: ____________ Date: ____________ Movements: ____________ Start time: ____________ Michelle Michelle [name] time: ____________ Document Released: 04/18/2006 Document Revised: 03/08/2011 Document Reviewed: 10/19/2008 [employer] Patient Information 2012 [employer].Normal Labor and Delivery Your caregiver must first be sure you are in labor. Signs of labor include:  You may pass what is called "the mucus plug" before labor begins. This is a small amount of blood stained mucus.   Regular uterine contractions.   The time between contractions get closer together.   The discomfort and pain gradually gets more intense.   Pains are mostly located in the back.   Pains get worse when walking.   The cervix (the opening of the uterus becomes thinner (begins to efface) and opens up (dilates).  Once you are in labor and admitted into the hospital or care center, your caregiver will do the following:  A complete physical examination.   Check  your vital signs (blood pressure, pulse, temperature and the fetal heart rate).   Do a vaginal examination (using a sterile glove and lubricant) to determine:   The position (presentation) of the baby (head [vertex] or buttock first).   The level (station) of the baby's head in the birth canal.   The effacement and dilatation of the cervix.   You may have your pubic hair shaved and be given an enema depending on your caregiver and the circumstance.   An electronic monitor is usually placed on your abdomen. The monitor follows the length and intensity of the contractions, as well as the baby's heart rate.   Usually, your caregiver will insert an IV in your arm with a bottle of sugar water. This is done as a precaution so that medications can be given to you quickly during labor or delivery.  NORMAL LABOR AND DELIVERY IS DIVIDED UP INTO 3 STAGES: First Stage This is when regular contractions begin and the cervix begins to efface and dilate. This stage can last from 3 to 15 hours. The end  of the first stage is when the cervix is 100% effaced and 10 centimeters dilated. Pain medications may be given by   Injection (morphine, demerol, etc.)   Regional anesthesia (spinal, caudal or epidural, anesthetics given in different locations of the spine). Paracervical pain medication may be given, which is an injection of and anesthetic on each side of the cervix.  A pregnant woman may request to have "Natural Childbirth" which is not to have any medications or anesthesia during her labor and delivery. Second Stage This is when the baby comes down through the birth canal (vagina) and is born. This can take 1 to 4 hours. As the baby's head comes down through the birth canal, you may feel like you are going to have a bowel movement. You will get the urge to bear down and push until the baby is delivered. As the baby's head is being delivered, the caregiver will decide if an episiotomy (a cut in the perineum  and vagina area) is needed to prevent tearing of the tissue in this area. The episiotomy is sewn up after the delivery of the baby and placenta. Sometimes a mask with nitrous oxide is given for the mother to breath during the delivery of the baby to help if there is too much pain. The end of Stage 2 is when the baby is fully delivered. Then when the umbilical cord stops pulsating it is clamped and cut. Third Stage The third stage begins after the baby is completely delivered and ends after the placenta (afterbirth) is delivered. This usually takes 5 to 30 minutes. After the placenta is delivered, a medication is given either by intravenous or injection to help contract the uterus and prevent bleeding. The third stage is not painful and pain medication is usually not necessary. If an episiotomy was done, it is repaired at this time. After the delivery, the mother is watched and monitored closely for 1 to 2 hours to make sure there is no postpartum bleeding (hemorrhage). If there is a lot of bleeding, medication is given to contract the uterus and stop the bleeding. Document Released: 12/27/2007 Document Revised: 03/08/2011 Document Reviewed: 12/27/2007 [facility] Patient Information 2012 [place], [place].

## 2011-08-31 NOTE — Anesthesia Preprocedure Evaluation (Signed)

## 2011-08-31 NOTE — MAU Note (Signed)
Patient state she is having contractions every 5 minutes. Denies any bleeding or leaking and reports good fetal movement.  

## 2011-08-31 NOTE — MAU Note (Signed)
Pt states ctx's began this am around MN, ctx's per pt q52minutes. Denies bleeding or lof.

## 2011-09-01 LAB — CBC
HCT: 32.1 % — ABNORMAL LOW (ref 36.0–46.0)
Hemoglobin: 11 g/dL — ABNORMAL LOW (ref 12.0–15.0)
MCH: 27 pg (ref 26.0–34.0)
MCHC: 34.3 g/dL (ref 30.0–36.0)
MCV: 78.7 fL (ref 78.0–100.0)
Platelets: 193 K/uL (ref 150–400)
RBC: 4.08 MIL/uL (ref 3.87–5.11)
RDW: 14 % (ref 11.5–15.5)
WBC: 13 K/uL — ABNORMAL HIGH (ref 4.0–10.5)

## 2011-09-01 LAB — ABO/RH: ABO/RH(D): O POS

## 2011-09-01 MED ORDER — DIBUCAINE 1 % RE OINT: 1.0000 "application " | TOPICAL_OINTMENT | RECTAL | Status: DC | PRN

## 2011-09-01 MED ORDER — WITCH HAZEL-GLYCERIN EX PADS: 1.0000 "application " | MEDICATED_PAD | CUTANEOUS | Status: DC | PRN

## 2011-09-01 MED ORDER — ONDANSETRON HCL 4 MG PO TABS: 4.0000 mg | ORAL_TABLET | ORAL | Status: DC | PRN

## 2011-09-01 MED ORDER — DIPHENHYDRAMINE HCL 25 MG PO CAPS: 25.0000 mg | ORAL_CAPSULE | Freq: Four times a day (QID) | ORAL | Status: DC | PRN

## 2011-09-01 MED ORDER — METHYLERGONOVINE MALEATE 0.2 MG PO TABS
0.2000 mg | ORAL_TABLET | ORAL | Status: DC | PRN
Start: 2011-09-01 — End: 2011-09-02

## 2011-09-01 MED ORDER — OXYCODONE-ACETAMINOPHEN 5-325 MG PO TABS
1.0000 | ORAL_TABLET | ORAL | Status: DC | PRN
  Administered 2011-09-01: 1 via ORAL
  Filled 2011-09-01: qty 1

## 2011-09-01 MED ORDER — METHYLERGONOVINE MALEATE 0.2 MG/ML IJ SOLN: 0.2000 mg | INTRAMUSCULAR | Status: DC | PRN

## 2011-09-01 MED ORDER — PRENATAL MULTIVITAMIN CH
1.0000 | ORAL_TABLET | Freq: Every day | ORAL | Status: DC
  Administered 2011-09-01 – 2011-09-02 (×2): 1 via ORAL
  Filled 2011-09-01 (×2): qty 1

## 2011-09-01 MED ORDER — ONDANSETRON HCL 4 MG/2ML IJ SOLN: 4.0000 mg | INTRAMUSCULAR | Status: DC | PRN

## 2011-09-01 MED ORDER — OXYTOCIN 20 UNITS IN LACTATED RINGERS INFUSION - SIMPLE: 125.0000 mL/h | INTRAVENOUS | Status: DC | PRN

## 2011-09-01 MED ORDER — TETANUS-DIPHTH-ACELL PERTUSSIS 5-2.5-18.5 LF-MCG/0.5 IM SUSP: 0.5000 mL | Freq: Once | INTRAMUSCULAR | Status: DC

## 2011-09-01 MED ORDER — SENNOSIDES-DOCUSATE SODIUM 8.6-50 MG PO TABS
2.0000 | ORAL_TABLET | Freq: Every day | ORAL | Status: DC
  Administered 2011-09-01: 2 via ORAL

## 2011-09-01 MED ORDER — IBUPROFEN 600 MG PO TABS
600.0000 mg | ORAL_TABLET | Freq: Four times a day (QID) | ORAL | Status: DC
  Administered 2011-09-01 – 2011-09-02 (×6): 600 mg via ORAL
  Filled 2011-09-01 (×6): qty 1

## 2011-09-01 MED ORDER — LANOLIN HYDROUS EX OINT: TOPICAL_OINTMENT | CUTANEOUS | Status: DC | PRN

## 2011-09-01 MED ORDER — ZOLPIDEM TARTRATE 5 MG PO TABS: 5.0000 mg | ORAL_TABLET | Freq: Every evening | ORAL | Status: DC | PRN

## 2011-09-01 MED ORDER — BENZOCAINE-MENTHOL 20-0.5 % EX AERO
1.0000 "application " | INHALATION_SPRAY | CUTANEOUS | Status: DC | PRN
  Administered 2011-09-01: 1 via TOPICAL
  Filled 2011-09-01: qty 56

## 2011-09-01 MED ORDER — SIMETHICONE 80 MG PO CHEW: 80.0000 mg | CHEWABLE_TABLET | ORAL | Status: DC | PRN

## 2011-09-01 NOTE — Anesthesia Postprocedure Evaluation (Signed)
  Anesthesia Post-op Note  Patient: Michelle [name]  Procedure(s) Performed: * No procedures listed *  Patient Location: Mother/Baby  Anesthesia Type: Epidural  Level of Consciousness: awake, alert  and oriented  Airway and Oxygen Therapy: Patient Spontanous Breathing  Post-op Pain: mild  Post-op Assessment: Patient's Cardiovascular Status Stable, Respiratory Function Stable, Patent Airway, No signs of Nausea or vomiting and Pain level controlled  Post-op Vital Signs: stable  Complications: No apparent anesthesia complications

## 2011-09-01 NOTE — Progress Notes (Signed)
Post Partum Day 1 Subjective: no complaints  Objective: Blood pressure 109/65, pulse 73, temperature 98.4 F (36.9 C), temperature source Oral, resp. rate 98, height 6\' 1"  (1.854 m), weight 79.47 kg (175 lb 3.2 oz), last menstrual period 12/10/2010, SpO2 97.00%, unknown if currently breastfeeding.  Physical Exam:  General: alert and no distress Lochia: appropriate Uterine Fundus: firm Incision: healing well DVT Evaluation: No evidence of DVT seen on physical exam.   Basename 09/01/11 0529 08/31/11 1645  HGB 11.0* 12.0  HCT 32.1* 34.5*    Assessment/Plan: Plan for discharge tomorrow   LOS: 1 day   Tyrah Broers A 09/01/2011, 11:19 AM

## 2011-09-01 NOTE — Clinical Social Work Maternal (Signed)

## 2011-09-02 MED ORDER — FERROUS FUMARATE 325 (106 FE) MG PO TABS
1.0000 | ORAL_TABLET | Freq: Every day | ORAL | Status: DC
  Administered 2011-09-02: 106 mg via ORAL
  Filled 2011-09-02: qty 1

## 2011-09-02 MED ORDER — OXYCODONE-ACETAMINOPHEN 5-325 MG PO TABS: 1.0000 | ORAL_TABLET | ORAL | Status: AC | PRN

## 2011-09-02 MED ORDER — IBUPROFEN 600 MG PO TABS: 600.0000 mg | ORAL_TABLET | Freq: Four times a day (QID) | ORAL | Status: DC

## 2011-09-02 NOTE — Progress Notes (Signed)
Post Partum Day 2 Subjective: no complaints  Objective: Blood pressure 112/68, pulse 86, temperature 98.4 F (36.9 C), temperature source Oral, resp. rate 18, height 6\' 1"  (1.854 m), weight 79.47 kg (175 lb 3.2 oz), last menstrual period 12/10/2010, SpO2 97.00%, unknown if currently breastfeeding.  Physical Exam:  General: alert and no distress Lochia: appropriate Uterine Fundus: firm Incision: none DVT Evaluation: No evidence of DVT seen on physical exam.   Basename 09/01/11 0529 08/31/11 1645  HGB 11.0* 12.0  HCT 32.1* 34.5*    Assessment/Plan: Discharge home   LOS: 2 days   Michelle [name] 09/02/2011, 8:17 AM

## 2011-09-02 NOTE — Discharge Summary (Signed)
Obstetric Discharge Summary Reason for Admission: onset of labor Prenatal Procedures: ultrasound Intrapartum Procedures: spontaneous vaginal delivery Postpartum Procedures: none Complications-Operative and Postpartum: none Hemoglobin  Date Value Range Status  09/01/2011 11.0* 12.0-15.0 (g/dL) Final     HCT  Date Value Range Status  09/01/2011 32.1* 36.0-46.0 (%) Final    Physical Exam:  General: alert and no distress Lochia: appropriate Uterine Fundus: firm Incision: none DVT Evaluation: No evidence of DVT seen on physical exam.  Discharge Diagnoses: Term Pregnancy-delivered  Discharge Information: Date: 09/02/2011 Activity: pelvic rest Diet: routine Medications: PNV, Ibuprofen, Colace, Iron and Percocet Condition: stable Instructions: refer to practice specific booklet Discharge to: home Follow-up Information    Follow up with Antionette Char A, MD. Schedule an appointment as soon as possible for a visit in 6 weeks.   Contact information:   [address], [address] [place] [address] [phone]          Newborn Data: Live born female  Birth Weight: 6 lb 13 oz (3090 g) APGAR: 9, 10  Home with mother.  Michelle [name] A 09/02/2011, 8:26 AM

## 2011-09-03 NOTE — Progress Notes (Signed)
Post discharge chart review completed.  

## 2011-09-22 ENCOUNTER — Encounter (HOSPITAL_COMMUNITY): Payer: Self-pay | Admitting: Emergency Medicine

## 2011-09-22 DIAGNOSIS — K029 Dental caries, unspecified: Secondary | ICD-10-CM | POA: Insufficient documentation

## 2011-09-22 DIAGNOSIS — M542 Cervicalgia: Secondary | ICD-10-CM | POA: Insufficient documentation

## 2011-09-22 DIAGNOSIS — G43909 Migraine, unspecified, not intractable, without status migrainosus: Secondary | ICD-10-CM | POA: Insufficient documentation

## 2011-09-22 NOTE — ED Notes (Signed)
PT. REPORTS LEFT HEADACHE WITH LEFT EAR ACHE/ LEFT FACIAL PAIN FOR 3 DAYS DENIES INJURY , UNRELIEVED BY PRESCRIPTION PERCOCET AND IBUPROFEN . STATES 2 WEEKS POST PARTUM .

## 2011-09-23 ENCOUNTER — Emergency Department (HOSPITAL_COMMUNITY): Payer: Medicaid Other

## 2011-09-23 ENCOUNTER — Emergency Department (HOSPITAL_COMMUNITY)
Admission: EM | Admit: 2011-09-23 | Discharge: 2011-09-23 | Disposition: A | Discharge status: Home / Self Care | Payer: Medicaid Other | Attending: Emergency Medicine | Admitting: Emergency Medicine

## 2011-09-23 DIAGNOSIS — K029 Dental caries, unspecified: Secondary | ICD-10-CM

## 2011-09-23 DIAGNOSIS — G4489 Other headache syndrome: Secondary | ICD-10-CM

## 2011-09-23 DIAGNOSIS — G43909 Migraine, unspecified, not intractable, without status migrainosus: Secondary | ICD-10-CM

## 2011-09-23 LAB — DIFFERENTIAL
Basophils Absolute: 0 10*3/uL (ref 0.0–0.1)
Basophils Relative: 0 % (ref 0–1)
Eosinophils Relative: 3 % (ref 0–5)
Lymphocytes Relative: 47 % — ABNORMAL HIGH (ref 12–46)
Neutro Abs: 2.7 10*3/uL (ref 1.7–7.7)

## 2011-09-23 LAB — CBC
MCHC: 34.6 g/dL (ref 30.0–36.0)
Platelets: 289 10*3/uL (ref 150–400)
RDW: 14.6 % (ref 11.5–15.5)
WBC: 6 10*3/uL (ref 4.0–10.5)

## 2011-09-23 LAB — POCT I-STAT, CHEM 8
BUN: 8 mg/dL (ref 6–23)
Calcium, Ion: 1.18 mmol/L (ref 1.12–1.32)
Chloride: 101 mEq/L (ref 96–112)
HCT: 38 % (ref 36.0–46.0)
Potassium: 3.7 mEq/L (ref 3.5–5.1)

## 2011-09-23 LAB — HEPATIC FUNCTION PANEL
ALT: 23 U/L (ref 0–35)
AST: 29 U/L (ref 0–37)
Indirect Bilirubin: 1 mg/dL — ABNORMAL HIGH (ref 0.3–0.9)
Total Protein: 6.6 g/dL (ref 6.0–8.3)

## 2011-09-23 LAB — RAPID STREP SCREEN (MED CTR MEBANE ONLY): Streptococcus, Group A Screen (Direct): NEGATIVE

## 2011-09-23 MED ORDER — KETOROLAC TROMETHAMINE 30 MG/ML IJ SOLN
30.0000 mg | Freq: Once | INTRAMUSCULAR | Status: AC
  Administered 2011-09-23: 30 mg via INTRAVENOUS
  Filled 2011-09-23: qty 1

## 2011-09-23 MED ORDER — PENICILLIN V POTASSIUM 500 MG PO TABS: 500.0000 mg | ORAL_TABLET | Freq: Four times a day (QID) | ORAL | Status: AC

## 2011-09-23 MED ORDER — DEXAMETHASONE SODIUM PHOSPHATE 4 MG/ML IJ SOLN
4.0000 mg | Freq: Once | INTRAMUSCULAR | Status: AC
  Administered 2011-09-23: 4 mg via INTRAVENOUS
  Filled 2011-09-23: qty 1

## 2011-09-23 MED ORDER — PROMETHAZINE HCL 25 MG/ML IJ SOLN
25.0000 mg | Freq: Once | INTRAMUSCULAR | Status: DC
  Filled 2011-09-23: qty 1

## 2011-09-23 MED ORDER — SODIUM CHLORIDE 0.9 % IV BOLUS (SEPSIS)
1000.0000 mL | Freq: Once | INTRAVENOUS | Status: AC
  Administered 2011-09-23: 1000 mL via INTRAVENOUS

## 2011-09-23 MED ORDER — DIPHENHYDRAMINE HCL 50 MG/ML IJ SOLN
12.5000 mg | Freq: Once | INTRAMUSCULAR | Status: AC
  Administered 2011-09-23: 12.5 mg via INTRAVENOUS
  Filled 2011-09-23: qty 1

## 2011-09-23 MED ORDER — METOCLOPRAMIDE HCL 5 MG/ML IJ SOLN
10.0000 mg | Freq: Once | INTRAMUSCULAR | Status: AC
  Administered 2011-09-23: 10 mg via INTRAVENOUS
  Filled 2011-09-23: qty 2

## 2011-09-23 MED ORDER — IBUPROFEN 800 MG PO TABS: 800.0000 mg | ORAL_TABLET | Freq: Three times a day (TID) | ORAL | Status: AC

## 2011-09-23 MED ORDER — IOHEXOL 350 MG/ML SOLN
100.0000 mL | Freq: Once | INTRAVENOUS | Status: AC | PRN
  Administered 2011-09-23: 100 mL via INTRAVENOUS

## 2011-09-23 NOTE — ED Notes (Signed)
Pt c/o left sided head pain since Thursday.  States pain was not as intense yesterday but tonight, is feeling sharp, numbing pains in the left head and left facial area.  States also feels like her left arm is "numb" and "throat is closing up".  O2 sats are normal, and no throat swelling noted.

## 2011-09-23 NOTE — ED Provider Notes (Signed)
History     CSN: [id number]  Arrival date & time 09/22/11  2339   First MD Initiated Contact with Patient 09/23/11 0134      Chief Complaint  Patient presents with  . Headache    (Consider location/radiation/quality/duration/timing/severity/associated sxs/prior treatment) Patient is a 31 y.o. female presenting with headaches. The history is provided by the patient. No language interpreter was used.  Headache  This is a recurrent problem. The current episode started more than 2 days ago. The problem occurs constantly. The problem has not changed since onset.The headache is associated with nothing. The pain is located in the left unilateral region. The quality of the pain is described as sharp. The pain is at a severity of 9/10. The pain is severe. The pain radiates to the left neck (ear and face). Pertinent negatives include no anorexia, no fever, no malaise/fatigue, no chest pressure, no near-syncope, no orthopnea, no palpitations, no syncope and no shortness of breath. She has tried oral narcotic analgesics for the symptoms. The treatment provided mild relief.  Not sudden onset, also feels pain in throat and mouth pain.  No weakness.  No visual or speech changes.    Past Medical History  Diagnosis Date  . Preterm labor   . Urinary tract infection     Past Surgical History  Procedure Date  . Augmentation mammaplasty   . Breast enhancement surgery 06/2010    Family History  Problem Relation Age of Onset  . Anesthesia problems Neg Hx   . Hypotension Neg Hx   . Malignant hyperthermia Neg Hx   . Pseudochol deficiency Neg Hx   . Hearing loss Son   . Hypertension Maternal Aunt   . Hearing loss Maternal Aunt   . Hypertension Maternal Uncle   . Hypertension Maternal Grandmother   . Hypertension Maternal Grandfather     History  Substance Use Topics  . Smoking status: Former Games developer  . Smokeless tobacco: Never Used   Comment: 2010  . Alcohol Use: No    OB History    Grav  Para Term Preterm Abortions TAB SAB Ect Mult Living   4 4 2 2      4       Review of Systems  Constitutional: Negative for fever and malaise/fatigue.  HENT: Positive for ear pain. Negative for neck pain.   Respiratory: Negative for shortness of breath.   Cardiovascular: Negative for palpitations, orthopnea, syncope and near-syncope.  Gastrointestinal: Negative for anorexia.  Neurological: Positive for headaches. Negative for dizziness, facial asymmetry and weakness.  All other systems reviewed and are negative.    Allergies  Review of patient's allergies indicates no known allergies.  Home Medications   Current Outpatient Rx  Name Route Sig Dispense Refill  . CALCIUM CARBONATE 600 MG PO TABS Oral Take 600 mg by mouth daily.    . IBUPROFEN 600 MG PO TABS Oral Take 600 mg by mouth every 6 (six) hours as needed. For pain    . OXYCODONE-ACETAMINOPHEN 5-325 MG PO TABS Oral Take 1-2 tablets by mouth every 4 (four) hours as needed. For pain    . PRENATAL 27-0.8 MG PO TABS Oral Take 1 tablet by mouth daily.      . IBUPROFEN 600 MG PO TABS Oral Take 600 mg by mouth every 6 (six) hours. For pain      BP 108/65  Pulse 74  Temp 97.8 F (36.6 C) (Oral)  Resp 20  SpO2 100%  Physical Exam  Constitutional: She is  oriented to person, place, and time. She appears well-developed and well-nourished. No distress.  HENT:  Head: Normocephalic and atraumatic.  Right Ear: No mastoid tenderness. Tympanic membrane is not injected. No hemotympanum.  Left Ear: No mastoid tenderness. Tympanic membrane is not injected. No hemotympanum.  Mouth/Throat: Oropharynx is clear and moist. No oropharyngeal exudate.  Eyes: Conjunctivae and EOM are normal. Pupils are equal, round, and reactive to light.  Neck: Normal range of motion. Neck supple. No JVD present. No tracheal deviation present.  Cardiovascular: Normal rate and regular rhythm.   Pulmonary/Chest: Effort normal and breath sounds normal.  Abdominal:  Soft. Bowel sounds are normal. There is no tenderness. There is no rebound and no guarding.  Musculoskeletal: Normal range of motion.  Lymphadenopathy:    She has no cervical adenopathy.  Neurological: She is alert and oriented to person, place, and time. She has normal reflexes. She displays normal reflexes. No cranial nerve deficit.  Skin: Skin is warm and dry.  Psychiatric: She has a normal mood and affect.    ED Course  Procedures (including critical care time)  Labs Reviewed  CBC - Abnormal; Notable for the following:    HCT 34.7 (*)     All other components within normal limits  DIFFERENTIAL - Abnormal; Notable for the following:    Lymphocytes Relative 47 (*)     All other components within normal limits  HEPATIC FUNCTION PANEL - Abnormal; Notable for the following:    Albumin 3.3 (*)     Indirect Bilirubin 1.0 (*)     All other components within normal limits  POCT I-STAT, CHEM 8  RAPID STREP SCREEN  URINALYSIS, ROUTINE W REFLEX MICROSCOPIC   Ct Head Wo Contrast  09/23/2011  *RADIOLOGY REPORT*  Clinical Data: Left sided headache and left earache; left facial pain for 3 days.  CT HEAD WITHOUT CONTRAST  Technique:  Contiguous axial images were obtained from the base of the skull through the vertex without contrast.  Comparison: None.  Findings: There is no evidence of acute infarction, mass lesion, or intra- or extra-axial hemorrhage on CT.  The posterior fossa, including the cerebellum, brainstem and fourth ventricle, is within normal limits.  The third and lateral ventricles, and basal ganglia are unremarkable in appearance.  The cerebral hemispheres are symmetric in appearance, with normal gray- white differentiation.  No mass effect or midline shift is seen.  There is no evidence of fracture; visualized osseous structures are unremarkable in appearance.  The visualized portions of the orbits are within normal limits.  The paranasal sinuses and mastoid air cells are well-aerated.   No significant soft tissue abnormalities are seen.  IMPRESSION: Unremarkable noncontrast CT of the head.  Original Report Authenticated By: Tonia Ghent, M.D.     No diagnosis found.    MDM  The symptoms are not consistent with stroke.  Seen by neurology Dr. Benson Norway who does not feel this is a neurologic issue.   If CT V and angio of neck negative discharge with medication  Will treat for dental caries with penicillin and Ibuprofen as patient is breast feeding.  Follow up with dentistry and neurology for ongoing care.  Patient verbalizes understanding and agrees to follow up. Use barrier contraception while on antibiotics. If symptoms worsen return to the closest ED.        Jasmine Awe, MD 09/23/11 [phone]

## 2011-09-23 NOTE — ED Notes (Signed)
Rx x 2, pt voiced understanding to f/u with neurologist and dentist and return for worsening of condition.

## 2011-09-23 NOTE — Consult Note (Signed)
Chief Complaint:  Headaches HPI: This is a 31 year old female who recently delivered 2 weeks ago who presents with headaches and left facial pain.  Patient states the headaches began approximately 2 days ago and were abrupt in onset.  The pain is located in the left temporal region and left side of her neck.  The pain initial resolved with over the counter ibuprofen.  Tonight at approximately 10 pm she states the pain became a severe 10/10 pain prompting her to come to the emergency room.  The pain has been constant since onset.  She denies any nausea, vomiting, photophobia, or phonophobia.  She did experience intermittent left shoulder and elbow discomfort that she described as a tightening versus tingling sensation but she denies any other stroke like symptoms including focal weakness, numbness, slurred speech, or problems with talking.  She denies any recent trauma but does state she has a history of a broken left molar but she has been asymptomatic for during the past year but her dentist mentioned that she would likely need surgery.  Of note she experienced migraine headaches during her pregnancy which she describes as a bitemporal throbbing pain with associated nausea, vomiting, photophobia, and phonophobia.  She denies any stimulating  triggers for her current pain such as touching the side of her face, washing, biting, or chewing.  Past Medical History  Diagnosis Date  . Preterm labor   . Urinary tract infection     Past Surgical History  Procedure Date  . Augmentation mammaplasty   . Breast enhancement surgery 06/2010    FH: No history of migraine headaches, or stroke  Family History  Problem Relation Age of Onset  . Anesthesia problems Neg Hx   . Hypotension Neg Hx   . Malignant hyperthermia Neg Hx   . Pseudochol deficiency Neg Hx   . Hearing loss Son   . Hypertension Maternal Aunt   . Hearing loss Maternal Aunt   . Hypertension Maternal Uncle   . Hypertension Maternal  Grandmother   . Hypertension Maternal Grandfather    Social History:  reports that she has quit smoking. She has never used smokeless tobacco. She reports that she does not drink alcohol or use illicit drugs.  Allergies: No Known Allergies   ROS: All systems reviewed and are negative except as noted above  Blood pressure 108/65, pulse 74, temperature 97.8 F (36.6 C), temperature source Oral, resp. rate 20, SpO2 100.00%.   General Examination: Heart: RRR Lungs: CTAB Abdomen: soft, non tender, nondistended  Neuro exam: MS: Alert and oriented times three, speech fluent, able to follow simple and complex commands, able to name, repeat, and comprehend CN: PERRLA, EOMI, VFF,altered facial sensation due to wearing ice pack, face symmetric, hearing grossly intact, symmetric rise of palate and uvula, symmetric shoulder shrug, tongue midline Motor:  5/5 throughout,  No drift Reflexes: brisk throughout, no clonus, toes down going Sensation: intact to lt Coordination: intact fnf Gait: routine gait intact  Results for orders placed during the hospital encounter of 09/23/11 (from the past 48 hour(s))  CBC     Status: Abnormal   Collection Time   09/23/11 12:48 AM      Component Value Range Comment   WBC 6.0  4.0 - 10.5 K/uL    RBC 4.39  3.87 - 5.11 MIL/uL    Hemoglobin 12.0  12.0 - 15.0 g/dL    HCT 16.1 (*) 09.6 - 46.0 %    MCV 79.0  78.0 - 100.0  fL    MCH 27.3  26.0 - 34.0 pg    MCHC 34.6  30.0 - 36.0 g/dL    RDW 04.5  40.9 - 81.1 %    Platelets 289  150 - 400 K/uL   DIFFERENTIAL     Status: Abnormal   Collection Time   09/23/11 12:48 AM      Component Value Range Comment   Neutrophils Relative 45  43 - 77 %    Neutro Abs 2.7  1.7 - 7.7 K/uL    Lymphocytes Relative 47 (*) 12 - 46 %    Lymphs Abs 2.8  0.7 - 4.0 K/uL    Monocytes Relative 6  3 - 12 %    Monocytes Absolute 0.3  0.1 - 1.0 K/uL    Eosinophils Relative 3  0 - 5 %    Eosinophils Absolute 0.2  0.0 - 0.7 K/uL     Basophils Relative 0  0 - 1 %    Basophils Absolute 0.0  0.0 - 0.1 K/uL   HEPATIC FUNCTION PANEL     Status: Abnormal   Collection Time   09/23/11 12:48 AM      Component Value Range Comment   Total Protein 6.6  6.0 - 8.3 g/dL    Albumin 3.3 (*) 3.5 - 5.2 g/dL    AST 29  0 - 37 U/L    ALT 23  0 - 35 U/L    Alkaline Phosphatase 92  39 - 117 U/L    Total Bilirubin 1.2  0.3 - 1.2 mg/dL    Bilirubin, Direct 0.2  0.0 - 0.3 mg/dL    Indirect Bilirubin 1.0 (*) 0.3 - 0.9 mg/dL   POCT I-STAT, CHEM 8     Status: Normal   Collection Time   09/23/11  1:06 AM      Component Value Range Comment   Sodium 139  135 - 145 mEq/L    Potassium 3.7  3.5 - 5.1 mEq/L    Chloride 101  96 - 112 mEq/L    BUN 8  6 - 23 mg/dL    Creatinine, Ser 9.14  0.50 - 1.10 mg/dL    Glucose, Bld 88  70 - 99 mg/dL    Calcium, Ion 7.82  9.56 - 1.32 mmol/L    TCO2 25  0 - 100 mmol/L    Hemoglobin 12.9  12.0 - 15.0 g/dL    HCT 21.3  08.6 - 57.8 %   RAPID STREP SCREEN     Status: Normal   Collection Time   09/23/11  1:54 AM      Component Value Range Comment   Streptococcus, Group A Screen (Direct) NEGATIVE  NEGATIVE    Ct Head Wo Contrast  09/23/2011  *RADIOLOGY REPORT*  Clinical Data: Left sided headache and left earache; left facial pain for 3 days.  CT HEAD WITHOUT CONTRAST  Technique:  Contiguous axial images were obtained from the base of the skull through the vertex without contrast.  Comparison: None.  Findings: There is no evidence of acute infarction, mass lesion, or intra- or extra-axial hemorrhage on CT.  The posterior fossa, including the cerebellum, brainstem and fourth ventricle, is within normal limits.  The third and lateral ventricles, and basal ganglia are unremarkable in appearance.  The cerebral hemispheres are symmetric in appearance, with normal gray- white differentiation.  No mass effect or midline shift is seen.  There is no evidence of fracture; visualized osseous structures are unremarkable in  appearance.  The visualized portions of the orbits are within normal limits.  The paranasal sinuses and mastoid air cells are well-aerated.  No significant soft tissue abnormalities are seen.  IMPRESSION: Unremarkable noncontrast CT of the head.  Original Report Authenticated By: Tonia Ghent, M.D.    Assessment/Plan This is a 31 year old who presents with abrupt onset of left sided facial and neck pain. Her current neuro exam is noted for brisk reflexes without any evidence of pathological reflexes but is otherwise non focal.  Her presentation is concerning for atypical migraine versus dissection versus venous sinus thrombosis given recent pregnancy.  She may also have referred facial pain from a left broken molar.  Trigeminal neuralgia is also in ddx but appears unlikely given the constant nature of her symptoms and lack of trigger points.  1.) Head and neck pain- Recommend to obtain ct a head /neck to rule out dissection and ct -v to rule out venous sinus thrombosis.     Carnita Golob 09/23/2011, 3:49 AM

## 2011-09-23 NOTE — Discharge Instructions (Signed)

## 2012-06-19 ENCOUNTER — Ambulatory Visit (INDEPENDENT_AMBULATORY_CARE_PROVIDER_SITE_OTHER): Payer: BC Managed Care – PPO | Admitting: Physician Assistant

## 2012-06-19 VITALS — BP 110/80 | HR 80 | Temp 98.0°F | Resp 16 | Ht 73.0 in | Wt 154.0 lb

## 2012-06-19 DIAGNOSIS — R300 Dysuria: Secondary | ICD-10-CM

## 2012-06-19 DIAGNOSIS — N390 Urinary tract infection, site not specified: Secondary | ICD-10-CM

## 2012-06-19 LAB — POCT UA - MICROSCOPIC ONLY
Casts, Ur, LPF, POC: NEGATIVE
Crystals, Ur, HPF, POC: NEGATIVE
Yeast, UA: NEGATIVE

## 2012-06-19 LAB — POCT URINALYSIS DIPSTICK
Bilirubin, UA: NEGATIVE
Glucose, UA: NEGATIVE
Ketones, UA: NEGATIVE
Nitrite, UA: POSITIVE
Protein, UA: 30
Spec Grav, UA: 1.025
Urobilinogen, UA: 0.2
pH, UA: 5.5

## 2012-06-19 MED ORDER — PHENAZOPYRIDINE HCL 200 MG PO TABS: 200.0000 mg | ORAL_TABLET | Freq: Three times a day (TID) | ORAL | Status: DC | PRN

## 2012-06-19 MED ORDER — CIPROFLOXACIN HCL 500 MG PO TABS: 500.0000 mg | ORAL_TABLET | Freq: Two times a day (BID) | ORAL | Status: DC

## 2012-06-19 NOTE — Progress Notes (Signed)
  Subjective:    Patient ID: Michelle [name], female    DOB: May 21, 1980, 32 y.o.   MRN: [id number]  HPI 32 year old female presents with acute onset of urinary frequency, dysuria, suprapubic pressure, and hesitation.  States symptoms started suddenly last night and have progressively worsened throughout the day. Has a history of UTI's when she was younger but has not had one since she was 32 years old.  Denies fever, chills, nausea, vomiting, abdominal pain, flank pain, or vaginal discharge.      Review of Systems  Constitutional: Negative for fever and chills.  Gastrointestinal: Negative for nausea, vomiting and abdominal pain.  Genitourinary: Positive for dysuria and urgency. Negative for flank pain, vaginal discharge and menstrual problem.  Neurological: Negative for dizziness.       Objective:   Physical Exam  Constitutional: She is oriented to person, place, and time. She appears well-developed and well-nourished.  HENT:  Head: Normocephalic and atraumatic.  Right Ear: External ear normal.  Left Ear: External ear normal.  Eyes: Conjunctivae are normal.  Neck: Normal range of motion.  Cardiovascular: Normal rate, regular rhythm and normal heart sounds.   Pulmonary/Chest: Effort normal and breath sounds normal.  Abdominal: Soft. Bowel sounds are normal. There is tenderness in the suprapubic area. There is no CVA tenderness.  Neurological: She is alert and oriented to person, place, and time.  Psychiatric: She has a normal mood and affect. Her behavior is normal. Judgment and thought content normal.      Results for orders placed in visit on 06/19/12  POCT URINALYSIS DIPSTICK      Result Value Range   Color, UA yellow     Clarity, UA cloudy     Glucose, UA neg     Bilirubin, UA neg     Ketones, UA neg     Spec Grav, UA 1.025     Blood, UA large     pH, UA 5.5     Protein, UA 30     Urobilinogen, UA 0.2     Nitrite, UA pos     Leukocytes, UA moderate (2+)    POCT UA -  MICROSCOPIC ONLY      Result Value Range   WBC, Ur, HPF, POC TNTC     RBC, urine, microscopic 10-15     Bacteria, U Microscopic 1+     Mucus, UA small     Epithelial cells, urine per micros 2-4     Crystals, Ur, HPF, POC neg     Casts, Ur, LPF, POC neg     Yeast, UA neg         Assessment & Plan:  UTI (urinary tract infection)  Dysuria - Plan: POCT urinalysis dipstick, POCT UA - Microscopic Only, ciprofloxacin (CIPRO) 500 MG tablet, phenazopyridine (PYRIDIUM) 200 MG tablet, Urine culture  Start Cipro 500 mg bid x 7 days Increase fluids Urine culture sent Follow up if symptoms worsen or fail to improve.

## 2012-06-23 LAB — URINE CULTURE: Colony Count: >=100000

## 2012-09-25 ENCOUNTER — Ambulatory Visit: Payer: Self-pay | Admitting: Obstetrics

## 2012-09-29 ENCOUNTER — Encounter: Payer: Self-pay | Admitting: Obstetrics & Gynecology

## 2012-09-29 ENCOUNTER — Ambulatory Visit (INDEPENDENT_AMBULATORY_CARE_PROVIDER_SITE_OTHER): Payer: Managed Care, Other (non HMO) | Admitting: Obstetrics & Gynecology

## 2012-09-29 VITALS — BP 121/84 | HR 85 | Temp 98.3°F | Ht 73.0 in | Wt 152.0 lb

## 2012-09-29 DIAGNOSIS — Z3046 Encounter for surveillance of implantable subdermal contraceptive: Secondary | ICD-10-CM | POA: Insufficient documentation

## 2012-09-29 MED ORDER — NORETHINDRONE ACET-ETHINYL EST 1-20 MG-MCG PO TABS: 1.0000 | ORAL_TABLET | Freq: Every day | ORAL | Status: DC

## 2012-09-29 NOTE — Progress Notes (Deleted)
Subjective:     Michelle [name] is a 32 y.o. female here for a routine exam.  Current complaints: Patient reports she has had abnormal bleeding since she had Nexplanon inserted. Patient may skip a month and then have a prolonged cycle the next month. Patient is requesting removal of the device..  Personal health questionnaire reviewed: no.   Gynecologic History Patient's last menstrual period was 08/30/2012. Contraception: Nexplanon Last Pap: 1 year. Results were: normal   Obstetric History OB History   Grav Para Term Preterm Abortions TAB SAB Ect Mult Living                   {Common ambulatory SmartLinks:19316}  Review of Systems {ros; complete:30496}    Objective:    {exam; complete:18323}    Assessment:    Healthy female exam.    Plan:    {plan:19193}

## 2012-09-29 NOTE — Patient Instructions (Signed)
Sterilization Information, Female Female sterilization is a procedure to permanently prevent pregnancy. There are different ways to perform sterilization, but all either block or close the fallopian tubes so that your eggs cannot reach your uterus. If your egg cannot reach your uterus, sperm cannot fertilize the egg, and you cannot get pregnant.  Sterilization is performed by a surgical procedure. Sometimes these procedures are performed in a hospital while a patient is asleep. Sometimes they can be done in a clinic setting with the patient awake. The fallopian tubes can be surgically cut, tied, or sealed through a procedure called tubal ligation. The fallopian tubes can also be closed with clips or rings. Sterilization can also be done by placing a tiny coil into each fallopian tube, which causes scar tissue to grow inside the tube. The scar tissue then blocks the tubes.  Discuss sterilization with your caregiver to answer any concerns you or your partner may have. You may want to ask what type of sterilization your caregiver performs. Some caregivers may not perform all the various options. Sterilization is permanent and should only be done if you are sure you do not want children or do not want any more children. Having a sterilization reversed may not be successful.  STERILIZATION PROCEDURES  Laparoscopic sterilization. This is a surgical method performed at a time other than right after childbirth. Two incisions are made in the lower abdomen. A thin, lighted tube (laparoscope) is inserted into one of the incisions and is used to perform the procedure. The fallopian tubes are closed with a ring or a clip. An instrument that uses heat could be used to seal the tubes closed (electrocautery).   Mini-laparotomy. This is a surgical method done 1 or 2 days after giving birth. Typically, a small incision is made just below the belly button (umbilicus) and the fallopian tubes are exposed. The tubes can then be  sealed, tied, or cut.   Hysteroscopic sterilization. This is performed at a time other than right after childbirth. A tiny, spring-like coil is inserted through the cervix and uterus and placed into the fallopian tubes. The coil causes scaring and blocks the tubes. Other forms of contraception should be used for 3 months after the procedure to allow the scar tissue to form completely. Additionally, it is required hysterosalpingography be done 3 months later to ensure that the procedure was successful. Hysterosalpingography is a procedure that uses X-rays to look at your uterus and fallopian tubes after a material to make them show up better has been inserted. IS STERILIZATION SAFE? Sterilization is considered safe with very rare complications. Risks depend on the type of procedure you have. As with any surgical procedure, there are risks. Some risks of sterilization by any means include:   Bleeding.  Infection.  Reaction to anesthesia medicine.  Injury to surrounding organs. Risks specific to having hysteroscopic coils placed include:  The coils may not be placed correctly the first time.   The coils may move out of place.   The tubes may not get completely blocked after 3 months.   Injury to surrounding organs when placing the coil.  HOW EFFECTIVE IS FEMALE STERILIZATION? Sterilization is nearly 100% effective, but it can fail. Depending on the type of sterilization, the rate of failure can be as high as 3%. After hysteroscopic sterilization with placement of fallopian tube coils, you will need back-up birth control for 3 months after the procedure. Sterilization is effective for a lifetime.  BENEFITS OF STERILIZATION  It does   not affect your hormones, and therefore will not affect your menstrual periods, sexual desire, or performance.   It is effective for a lifetime.   It is safe.   You do not need to worry about getting pregnant. Keep in mind that if you had the  hysteroscopic placement procedure, you must wait 3 months after the procedure (or until your caregiver confirms) before pregnancy is not considered possible.   There are no side effects unlike other types of birth control (contraception).  DRAWBACKS OF STERILIZATION  You must be sure you do not want children or any more children. The procedure is permanent.   It does not provide protection against sexually transmitted infections (STIs).   The tubes can grow back together. If this happens, there is a risk of pregnancy. There is also an increased risk (50%) of pregnancy being an ectopic pregnancy. This is a pregnancy that happens outside of the uterus. Document Released: 09/05/2007 Document Revised: 09/18/2011 Document Reviewed: 07/05/2011 [employer] Patient Information 2014 [employer].  

## 2012-09-29 NOTE — Progress Notes (Signed)
Reasons  for removal:  In place x 3 yrs   A timeout was performed confirming the patient, the procedure and allergy status. The patient's left  arm was palpated and the implant device located. The area was prepped with Betadinex3. The distal end of the device was palpated and 3 cc of 1% lidocaine was injected. A 5 mm incision was made. Any fibrotic tissue was carefully dissected away using blunt and/or sharp dissection. The device was removed in an intact manner. Steri-strips and a sterile dressing were applied to the incision. The patient tolerated the procedure well.  New contraceptive method: oral contraceptives (estrogen/progesterone)

## 2012-10-08 ENCOUNTER — Encounter: Payer: Self-pay | Admitting: Obstetrics & Gynecology

## 2012-10-10 ENCOUNTER — Ambulatory Visit: Payer: Managed Care, Other (non HMO) | Admitting: Obstetrics & Gynecology

## 2012-10-10 ENCOUNTER — Ambulatory Visit: Payer: Self-pay | Admitting: Obstetrics & Gynecology

## 2012-11-14 ENCOUNTER — Encounter: Payer: Self-pay | Admitting: Obstetrics & Gynecology

## 2014-02-01 ENCOUNTER — Encounter (HOSPITAL_COMMUNITY): Payer: Self-pay | Admitting: Emergency Medicine

## 2014-05-14 ENCOUNTER — Encounter (HOSPITAL_COMMUNITY): Payer: Self-pay | Admitting: Emergency Medicine

## 2014-05-14 ENCOUNTER — Emergency Department (INDEPENDENT_AMBULATORY_CARE_PROVIDER_SITE_OTHER): Payer: Managed Care, Other (non HMO)

## 2014-05-14 ENCOUNTER — Emergency Department (INDEPENDENT_AMBULATORY_CARE_PROVIDER_SITE_OTHER)
Admission: EM | Admit: 2014-05-14 | Discharge: 2014-05-14 | Disposition: A | Discharge status: Home / Self Care | Payer: Managed Care, Other (non HMO) | Source: Home / Self Care | Attending: Family Medicine | Admitting: Family Medicine

## 2014-05-14 ENCOUNTER — Emergency Department (HOSPITAL_COMMUNITY): Payer: Managed Care, Other (non HMO)

## 2014-05-14 DIAGNOSIS — R0789 Other chest pain: Secondary | ICD-10-CM

## 2014-05-14 MED ORDER — DICLOFENAC POTASSIUM 50 MG PO TABS: 50.0000 mg | ORAL_TABLET | Freq: Three times a day (TID) | ORAL | Status: DC

## 2014-05-14 NOTE — ED Notes (Signed)
Pt given d/c instructions and rx's.  Pt stated understanding.  All questions were answered and pt walked to the lobby independently.  A&O w/ VSS.  Pt still stated a 3/10 pain on d/c.

## 2014-05-14 NOTE — ED Notes (Signed)
Pt started with dizziness and chest pressure, with SOB on her way to work.  While at work she experienced hot flashes, sweating, dizziness and blurred vision.  She went to employee health and was sent here.  Pt is SOB on assessment and complains of 6/10 chest pressure.

## 2014-05-14 NOTE — Discharge Instructions (Signed)
Use medicine as needed, return if further problems.

## 2014-05-14 NOTE — ED Provider Notes (Signed)
CSN: [id number]     Arrival date & time 05/14/14  1135 History   First MD Initiated Contact with Patient 05/14/14 1153     Chief Complaint  Patient presents with  . Chest Pain  . Shortness of Breath  . Dizziness  . Blurred Vision  . Hot Flashes    with sweating   (Consider location/radiation/quality/duration/timing/severity/associated sxs/prior Treatment) Patient is a 34 y.o. female presenting with chest pain. The history is provided by the patient.  Chest Pain Pain location:  L lateral chest Pain quality: sharp   Pain radiates to:  Does not radiate Pain radiates to the back: no   Pain severity:  Mild Onset quality:  Sudden Duration:  3 hours Progression:  Unchanged Chronicity:  New Context: movement and raising an arm   Relieved by:  None tried Worsened by:  Nothing tried Associated symptoms: anxiety and shortness of breath   Associated symptoms: no cough and no palpitations   Risk factors comment:  Bilat breast implants 1 yr ago.   History reviewed. No pertinent past medical history. Past Surgical History  Procedure Laterality Date  . Breast surgery     Family History  Problem Relation Age of Onset  . Alzheimer's disease Maternal Grandmother    History  Substance Use Topics  . Smoking status: Never Smoker   . Smokeless tobacco: Not on file  . Alcohol Use: Yes     Comment: once a week   OB History    No data available     Review of Systems  Constitutional: Negative.   Respiratory: Positive for shortness of breath. Negative for cough and chest tightness.   Cardiovascular: Positive for chest pain. Negative for palpitations and leg swelling.    Allergies  Review of patient's allergies indicates no known allergies.  Home Medications   Prior to Admission medications   Medication Sig Start Date End Date Taking? Authorizing Provider  ibuprofen (ADVIL,MOTRIN) 200 MG tablet Take 200 mg by mouth every 6 (six) hours as needed for pain.   Yes Historical Provider,  MD  ciprofloxacin (CIPRO) 500 MG tablet Take 1 tablet (500 mg total) by mouth 2 (two) times daily. 06/19/12   Heather Jaquita RectorM Marte, PA-C  diclofenac (CATAFLAM) 50 MG tablet Take 1 tablet (50 mg total) by mouth 3 (three) times daily. 05/14/14   Linna HoffJames D Kindl, MD  norethindrone-ethinyl estradiol (MICROGESTIN,JUNEL,LOESTRIN) 1-20 MG-MCG tablet Take 1 tablet by mouth daily. 09/29/12   Antionette CharLisa Jackson-Moore, MD  phenazopyridine (PYRIDIUM) 200 MG tablet Take 1 tablet (200 mg total) by mouth 3 (three) times daily as needed for pain. 06/19/12   Heather M Marte, PA-C   BP 114/79 mmHg  Pulse 72  Temp(Src) 98.6 F (37 C) (Oral)  Resp 36  SpO2 100%  LMP 05/12/2014 Physical Exam  Constitutional: She is oriented to person, place, and time. She appears well-developed and well-nourished. No distress.  Neck: Normal range of motion. Neck supple.  Cardiovascular: Regular rhythm, normal heart sounds and intact distal pulses.   Pulmonary/Chest: Effort normal and breath sounds normal. She exhibits tenderness.  Musculoskeletal: She exhibits no edema.  Neurological: She is alert and oriented to person, place, and time.  Skin: Skin is warm and dry.    ED Course  Procedures (including critical care time) Labs Review Labs Reviewed - No data to display  Imaging Review No results found. X-rays reviewed and report per radiologist.   MDM   1. Acute chest wall pain  Linna Hoff, MD 05/14/14 [phone]

## 2014-10-01 ENCOUNTER — Inpatient Hospital Stay (HOSPITAL_COMMUNITY)
Admission: AD | Admit: 2014-10-01 | Discharge: 2014-10-01 | Disposition: A | Discharge status: Home / Self Care | Payer: Managed Care, Other (non HMO) | Source: Ambulatory Visit | Attending: Obstetrics and Gynecology | Admitting: Obstetrics and Gynecology

## 2014-10-01 ENCOUNTER — Encounter (HOSPITAL_COMMUNITY): Payer: Self-pay | Admitting: *Deleted

## 2014-10-01 DIAGNOSIS — Z3A08 8 weeks gestation of pregnancy: Secondary | ICD-10-CM | POA: Diagnosis not present

## 2014-10-01 DIAGNOSIS — O210 Mild hyperemesis gravidarum: Secondary | ICD-10-CM | POA: Insufficient documentation

## 2014-10-01 DIAGNOSIS — Z87891 Personal history of nicotine dependence: Secondary | ICD-10-CM | POA: Diagnosis not present

## 2014-10-01 HISTORY — DX: Unspecified infectious disease: B99.9

## 2014-10-01 LAB — COMPREHENSIVE METABOLIC PANEL
ALT: 15 U/L (ref 14–54)
AST: 17 U/L (ref 15–41)
Albumin: 3.9 g/dL (ref 3.5–5.0)
Alkaline Phosphatase: 45 U/L (ref 38–126)
Anion gap: 6 (ref 5–15)
BILIRUBIN TOTAL: 2.2 mg/dL — AB (ref 0.3–1.2)
BUN: 10 mg/dL (ref 6–20)
CO2: 23 mmol/L (ref 22–32)
Calcium: 8.7 mg/dL — ABNORMAL LOW (ref 8.9–10.3)
Chloride: 105 mmol/L (ref 101–111)
Creatinine, Ser: 0.59 mg/dL (ref 0.44–1.00)
GFR calc Af Amer: >60 mL/min (ref >60)
Glucose, Bld: 74 mg/dL (ref 65–99)
POTASSIUM: 3.5 mmol/L (ref 3.5–5.1)
SODIUM: 134 mmol/L — AB (ref 135–145)
Total Protein: 6.9 g/dL (ref 6.5–8.1)

## 2014-10-01 LAB — AMYLASE: Amylase: 151 U/L — ABNORMAL HIGH (ref 28–100)

## 2014-10-01 LAB — URINALYSIS, ROUTINE W REFLEX MICROSCOPIC
BILIRUBIN URINE: NEGATIVE
GLUCOSE, UA: NEGATIVE mg/dL
Ketones, ur: >80 mg/dL — AB
Leukocytes, UA: NEGATIVE
Nitrite: NEGATIVE
PH: 6 (ref 5.0–8.0)
Protein, ur: NEGATIVE mg/dL
Specific Gravity, Urine: 1.015 (ref 1.005–1.030)
UROBILINOGEN UA: 0.2 mg/dL (ref 0.0–1.0)

## 2014-10-01 LAB — URINE MICROSCOPIC-ADD ON

## 2014-10-01 LAB — LIPASE, BLOOD: Lipase: 10 U/L — ABNORMAL LOW (ref 22–51)

## 2014-10-01 MED ORDER — DEXTROSE 5 % IN LACTATED RINGERS IV BOLUS
1000.0000 mL | Freq: Once | INTRAVENOUS | Status: AC
  Administered 2014-10-01: 1000 mL via INTRAVENOUS

## 2014-10-01 MED ORDER — FAMOTIDINE IN NACL 20-0.9 MG/50ML-% IV SOLN
20.0000 mg | Freq: Once | INTRAVENOUS | Status: AC
  Administered 2014-10-01: 20 mg via INTRAVENOUS
  Filled 2014-10-01: qty 50

## 2014-10-01 MED ORDER — LACTATED RINGERS IV BOLUS (SEPSIS)
1000.0000 mL | Freq: Once | INTRAVENOUS | Status: AC
  Administered 2014-10-01: 1000 mL via INTRAVENOUS

## 2014-10-01 MED ORDER — PROMETHAZINE HCL 25 MG/ML IJ SOLN: 12.5000 mg | INTRAMUSCULAR | Status: DC | PRN

## 2014-10-01 MED ORDER — ONDANSETRON 8 MG PO TBDP: 8.0000 mg | ORAL_TABLET | Freq: Three times a day (TID) | ORAL | Status: DC | PRN

## 2014-10-01 MED ORDER — ONDANSETRON HCL 4 MG/2ML IJ SOLN
4.0000 mg | Freq: Once | INTRAMUSCULAR | Status: AC
  Administered 2014-10-01: 4 mg via INTRAVENOUS
  Filled 2014-10-01: qty 2

## 2014-10-01 MED ORDER — DOXYLAMINE-PYRIDOXINE 10-10 MG PO TBEC: 2.0000 | DELAYED_RELEASE_TABLET | Freq: Every day | ORAL | Status: DC

## 2014-10-01 NOTE — MAU Provider Note (Signed)
History     CSN: [id number]  Arrival date and time: 10/01/14 1530   None     Chief Complaint  Patient presents with  . Emesis During Pregnancy   HPI Michelle [name] 34 y.o. [id number] @[redacted]w[redacted]d  presents to MAU for IV fluids.  She was seen in clinic earlier today and her MD advised that she come here.  She is not eating well and has nausea/vomiting /dry heaving all day.  She denies abdominal pain, dysuria, vaginal bleeding.   [name].   OB History    Gravida Para Term Preterm AB TAB SAB Ectopic Multiple Living   5 4 2 2   0 0 0 0 4      Past Medical History  Diagnosis Date  . Infection     UTI    Past Surgical History  Procedure Laterality Date  . Breast surgery      augementation    Family History  Problem Relation Age of Onset  . Alzheimer's disease Maternal Grandmother   . Asthma Neg Hx   . Cancer Neg Hx   . Diabetes Neg Hx   . Heart disease Neg Hx   . Stroke Neg Hx     History  Substance Use Topics  . Smoking status: Former Games developermoker  . Smokeless tobacco: Never Used     Comment: 2001  . Alcohol Use: Yes     Comment: once a wee; not with pregk    Allergies: No Known Allergies  Prescriptions prior to admission  Medication Sig Dispense Refill Last Dose  . ciprofloxacin (CIPRO) 500 MG tablet Take 1 tablet (500 mg total) by mouth 2 (two) times daily. 14 tablet 0 Not Taking  . diclofenac (CATAFLAM) 50 MG tablet Take 1 tablet (50 mg total) by mouth 3 (three) times daily. 30 tablet 0   . ibuprofen (ADVIL,MOTRIN) 200 MG tablet Take 200 mg by mouth every 6 (six) hours as needed for pain.   Past Month at Unknown time  . norethindrone-ethinyl estradiol (MICROGESTIN,JUNEL,LOESTRIN) 1-20 MG-MCG tablet Take 1 tablet by mouth daily. 1 Package 11 Unknown at Unknown time  . phenazopyridine (PYRIDIUM) 200 MG tablet Take 1 tablet (200 mg total) by mouth 3 (three) times daily as needed for pain. 10 tablet 0 Not Taking    ROS Pertinent ROS in  HPI.  All other systems are negative.   Physical Exam   Blood pressure 101/75, pulse 81, temperature 98.2 F (36.8 C), temperature source Oral, resp. rate 16, height 5\' 11"  (1.803 m), weight 153 lb (69.4 kg), last menstrual period 05/12/2014.  Physical Exam  Constitutional: She is oriented to person, place, and time. She appears well-developed and well-nourished.  HENT:  Head: Normocephalic and atraumatic.  Eyes: EOM are normal.  Neck: Normal range of motion.  Cardiovascular: Normal rate and normal heart sounds.   Respiratory: Effort normal and breath sounds normal. No respiratory distress.  GI: Soft. She exhibits no distension. There is no tenderness.  Musculoskeletal: Normal range of motion.  Neurological: She is alert and oriented to person, place, and time.  Skin: Skin is warm and dry.  Psychiatric: She has a normal mood and affect.    MAU Course  Procedures  MDM Labs ordered per Dr. Dion BodyVarnado. Results for orders placed or performed during the hospital encounter of 10/01/14 (from the past 24 hour(s))  Comprehensive metabolic panel     Status: Abnormal   Collection Time: 10/01/14  5:05 PM  Result Value Ref Range   Sodium 134 (L) 135 - 145 mmol/L   Potassium 3.5 3.5 - 5.1 mmol/L   Chloride 105 101 - 111 mmol/L   CO2 23 22 - 32 mmol/L   Glucose, Bld 74 65 - 99 mg/dL   BUN 10 6 - 20 mg/dL   Creatinine, Ser 1.61 0.44 - 1.00 mg/dL   Calcium 8.7 (L) 8.9 - 10.3 mg/dL   Total Protein 6.9 6.5 - 8.1 g/dL   Albumin 3.9 3.5 - 5.0 g/dL   AST 17 15 - 41 U/L   ALT 15 14 - 54 U/L   Alkaline Phosphatase 45 38 - 126 U/L   Total Bilirubin 2.2 (H) 0.3 - 1.2 mg/dL   GFR calc non Af Amer >60 >60 mL/min   GFR calc Af Amer >60 >60 mL/min   Anion gap 6 5 - 15  Amylase     Status: Abnormal   Collection Time: 10/01/14  5:05 PM  Result Value Ref Range   Amylase 151 (H) 28 - 100 U/L  Lipase, blood     Status: Abnormal   Collection Time: 10/01/14  5:05 PM  Result Value Ref Range   Lipase 10  (L) 22 - 51 U/L  Urinalysis, Routine w reflex microscopic (not at [facility])     Status: Abnormal   Collection Time: 10/01/14  6:05 PM  Result Value Ref Range   Color, Urine YELLOW YELLOW   APPearance CLEAR CLEAR   Specific Gravity, Urine 1.015 1.005 - 1.030   pH 6.0 5.0 - 8.0   Glucose, UA NEGATIVE NEGATIVE mg/dL   Hgb urine dipstick SMALL (A) NEGATIVE   Bilirubin Urine NEGATIVE NEGATIVE   Ketones, ur >80 (A) NEGATIVE mg/dL   Protein, ur NEGATIVE NEGATIVE mg/dL   Urobilinogen, UA 0.2 0.0 - 1.0 mg/dL   Nitrite NEGATIVE NEGATIVE   Leukocytes, UA NEGATIVE NEGATIVE  Urine microscopic-add on     Status: None   Collection Time: 10/01/14  6:05 PM  Result Value Ref Range   Squamous Epithelial / LPF RARE RARE   WBC, UA 0-2 <3 WBC/hpf   RBC / HPF 0-2 <3 RBC/hpf   Bacteria, UA RARE RARE   MD to bedside to assume care.  Assessment and Plan    Michelle [name] 10/01/2014, 4:50 PM

## 2014-10-01 NOTE — Discharge Instructions (Signed)

## 2014-10-01 NOTE — MAU Note (Signed)
MD sent me over to IV's for dehydration.  N/v getting worse. Has  Lost 12# in 30 days.

## 2014-10-02 ENCOUNTER — Telehealth: Payer: Self-pay | Admitting: Obstetrics and Gynecology

## 2014-10-02 NOTE — Telephone Encounter (Signed)
TC from patient of Dr. Fermin SchwabVarnado--8 4/7 weeks, treated yesterday for N/V with IV fluids, home with Zofran and Diclegis, now with diarrhea.  Ate raw broccoli, celery, carrots, and fruit last night after getting home from hospital, then diarrhea started.  Keeping fluids and tablets down today, no N/V, no cramping or bleeding.  Recommended BRAT diet (patient doesn't like apple sauce) and Immodium for no more than 2-3 doses.  To call with any worsening of sx, and to keep to bland diet.  Michelle BridgemanVicki Salinda [name], [facility] CenterCNN 10/02/14 11a

## 2014-10-07 ENCOUNTER — Encounter (HOSPITAL_COMMUNITY): Payer: Self-pay | Admitting: Emergency Medicine

## 2014-10-20 ENCOUNTER — Other Ambulatory Visit: Payer: Self-pay | Admitting: Obstetrics and Gynecology

## 2014-10-20 ENCOUNTER — Other Ambulatory Visit (HOSPITAL_COMMUNITY)
Admission: RE | Admit: 2014-10-20 | Discharge: 2014-10-20 | Disposition: A | Discharge status: Home / Self Care | Payer: Managed Care, Other (non HMO) | Source: Ambulatory Visit | Attending: Obstetrics and Gynecology | Admitting: Obstetrics and Gynecology

## 2014-10-20 DIAGNOSIS — Z01411 Encounter for gynecological examination (general) (routine) with abnormal findings: Secondary | ICD-10-CM | POA: Insufficient documentation

## 2014-10-20 DIAGNOSIS — Z1151 Encounter for screening for human papillomavirus (HPV): Secondary | ICD-10-CM | POA: Insufficient documentation

## 2014-10-20 DIAGNOSIS — R87810 Cervical high risk human papillomavirus (HPV) DNA test positive: Secondary | ICD-10-CM | POA: Diagnosis present

## 2014-10-21 LAB — CYTOLOGY - PAP

## 2018-09-18 ENCOUNTER — Emergency Department (HOSPITAL_COMMUNITY): Payer: 59

## 2018-09-18 ENCOUNTER — Encounter (HOSPITAL_COMMUNITY): Payer: Self-pay | Admitting: Emergency Medicine

## 2018-09-18 ENCOUNTER — Other Ambulatory Visit: Payer: Self-pay

## 2018-09-18 ENCOUNTER — Emergency Department (HOSPITAL_COMMUNITY)
Admission: EM | Admit: 2018-09-18 | Discharge: 2018-09-18 | Disposition: A | Discharge status: Home / Self Care | Payer: 59 | Attending: Emergency Medicine | Admitting: Emergency Medicine

## 2018-09-18 DIAGNOSIS — Z87891 Personal history of nicotine dependence: Secondary | ICD-10-CM | POA: Diagnosis not present

## 2018-09-18 DIAGNOSIS — R202 Paresthesia of skin: Secondary | ICD-10-CM | POA: Insufficient documentation

## 2018-09-18 DIAGNOSIS — Z79899 Other long term (current) drug therapy: Secondary | ICD-10-CM | POA: Insufficient documentation

## 2018-09-18 LAB — COMPREHENSIVE METABOLIC PANEL
ALT: 20 U/L (ref 0–44)
AST: 21 U/L (ref 15–41)
Albumin: 4.6 g/dL (ref 3.5–5.0)
Alkaline Phosphatase: 59 U/L (ref 38–126)
Anion gap: 9 (ref 5–15)
BUN: 18 mg/dL (ref 6–20)
CO2: 27 mmol/L (ref 22–32)
Calcium: 9.7 mg/dL (ref 8.9–10.3)
Chloride: 101 mmol/L (ref 98–111)
Creatinine, Ser: 0.87 mg/dL (ref 0.44–1.00)
GFR calc Af Amer: >60 mL/min (ref >60)
GFR calc non Af Amer: >60 mL/min (ref >60)
Glucose, Bld: 84 mg/dL (ref 70–99)
Potassium: 4.2 mmol/L (ref 3.5–5.1)
Sodium: 137 mmol/L (ref 135–145)
Total Bilirubin: 1.8 mg/dL — ABNORMAL HIGH (ref 0.3–1.2)
Total Protein: 7.8 g/dL (ref 6.5–8.1)

## 2018-09-18 LAB — CBC WITH DIFFERENTIAL/PLATELET
Abs Immature Granulocytes: 0.01 10*3/uL (ref 0.00–0.07)
Basophils Absolute: 0 10*3/uL (ref 0.0–0.1)
Basophils Relative: 1 %
Eosinophils Absolute: 0.1 10*3/uL (ref 0.0–0.5)
Eosinophils Relative: 1 %
HCT: 41.6 % (ref 36.0–46.0)
Hemoglobin: 14.2 g/dL (ref 12.0–15.0)
Immature Granulocytes: 0 %
Lymphocytes Relative: 38 %
Lymphs Abs: 2.1 10*3/uL (ref 0.7–4.0)
MCH: 28.1 pg (ref 26.0–34.0)
MCHC: 34.1 g/dL (ref 30.0–36.0)
MCV: 82.2 fL (ref 80.0–100.0)
Monocytes Absolute: 0.3 10*3/uL (ref 0.1–1.0)
Monocytes Relative: 5 %
Neutro Abs: 3.1 10*3/uL (ref 1.7–7.7)
Neutrophils Relative %: 55 %
Platelets: 300 10*3/uL (ref 150–400)
RBC: 5.06 MIL/uL (ref 3.87–5.11)
RDW: 13.9 % (ref 11.5–15.5)
WBC: 5.6 10*3/uL (ref 4.0–10.5)
nRBC: 0 % (ref 0.0–0.2)

## 2018-09-18 LAB — URINALYSIS, ROUTINE W REFLEX MICROSCOPIC
Bilirubin Urine: NEGATIVE
Glucose, UA: NEGATIVE mg/dL
Hgb urine dipstick: NEGATIVE
Ketones, ur: NEGATIVE mg/dL
Leukocytes,Ua: NEGATIVE
Nitrite: NEGATIVE
Protein, ur: NEGATIVE mg/dL
Specific Gravity, Urine: 1.018 (ref 1.005–1.030)
pH: 6 (ref 5.0–8.0)

## 2018-09-18 LAB — I-STAT BETA HCG BLOOD, ED (MC, WL, AP ONLY): I-stat hCG, quantitative: <5 m[IU]/mL (ref <5)

## 2018-09-18 LAB — LIPASE, BLOOD: Lipase: 31 U/L (ref 11–51)

## 2018-09-18 NOTE — ED Triage Notes (Signed)
Patient went to urgent care and was referred to the ED. Patient complains of pain/ numbness in back radiating into the left leg. She had had this numbness for a week constantly. She also has had tingling in the left forearm that radiates into the fingers.  Tingling in She complains of nausea vomiting, headaches, abdominal "swelling" and abdominal pain that she says feels like contractions. Denies fever, changes in bowels. She believes she has urinary frequency and urine appears dark. She believes the dark urine is from drinking too much beet juice.

## 2018-09-18 NOTE — Discharge Instructions (Signed)
Follow-up with the neurologist if your symptoms become worse

## 2018-09-18 NOTE — ED Provider Notes (Addendum)
[facility]-EMERGENCY DEPT Provider Note   CSN: [id number] Arrival date & time: 09/18/18  1348     History   Chief Complaint No chief complaint on file.   HPI Michelle [name] is a 38 y.o. female.     38 year old female presents with intermittent left upper and left lower extremity paresthesias along with pain.  Has had a mild headache as well 2.  Also notes abdominal discomfort with emesis x1 yesterday but none currently.  No fever chills per denies any urinary symptoms.  Patient denies any constipation.  No diarrhea.  States that her current symptoms of paresthesias and pain is been persistent and without neck pain or discomfort.  She has had no visual changes.  No prior history of same.  No treatment use prior to arrival.     Past Medical History:  Diagnosis Date  . Infection    UTI  . Preterm labor   . Urinary tract infection     Patient Active Problem List   Diagnosis Date Noted  . Nexplanon removal 09/29/2012  . UNSPECIFIED ANEMIA 03/28/2010  . FATTY LIVER DISEASE 03/28/2010  . OVARIAN CYST 03/28/2010  . DYSMENORRHEA 03/28/2010    Past Surgical History:  Procedure Laterality Date  . AUGMENTATION MAMMAPLASTY    . BREAST ENHANCEMENT SURGERY  06/2010  . BREAST SURGERY     augementation     OB History    Gravida  9   Para  8   Term  4   Preterm  4   AB  0   Living  4     SAB  0   TAB  0   Ectopic  0   Multiple      Live Births  4            Home Medications    Prior to Admission medications   Medication Sig Start Date End Date Taking? Authorizing Provider  acetaminophen (TYLENOL) 500 MG tablet Take 500-1,000 mg by mouth every 6 (six) hours as needed for moderate pain or headache.    [provider]  calcium carbonate (OS-CAL) 600 MG TABS Take 600 mg by mouth daily.    [provider]  CALCIUM PO Take 1 tablet by mouth daily.    [provider]  Doxylamine-Pyridoxine 10-10 MG TBEC Take 2  tablets by mouth at bedtime. 10/01/14   Geryl RankinsV[place], Evelyn, MD  ibuprofen (ADVIL,MOTRIN) 600 MG tablet Take 600 mg by mouth every 6 (six) hours as needed. For pain    [provider]  MAGNESIUM PO Take 1 tablet by mouth daily.    [provider]  ondansetron (ZOFRAN ODT) 8 MG disintegrating tablet Take 1 tablet (8 mg total) by mouth every 8 (eight) hours as needed for nausea. 10/01/14   Geryl RankinsVarnado, Evelyn, MD  oxyCODONE-acetaminophen (PERCOCET) 5-325 MG per tablet Take 1-2 tablets by mouth every 4 (four) hours as needed. For pain    [provider]  Prenatal Vit-Fe Fumarate-FA (MULTIVITAMIN-PRENATAL) 27-0.8 MG TABS Take 1 tablet by mouth daily.      [provider]  Pyridoxine HCl (B-6 PO) Take 1 tablet by mouth daily.    [provider]    Family History Family History  Problem Relation Age of Onset  . Hypertension Maternal Grandmother   . Alzheimer's disease Maternal Grandmother   . Hearing loss Son   . Hypertension Maternal Grandfather   . Hypertension Maternal Aunt   . Hearing loss Maternal Aunt   .  Hypertension Maternal Uncle   . Anesthesia problems Neg Hx   . Hypotension Neg Hx   . Malignant hyperthermia Neg Hx   . Pseudochol deficiency Neg Hx   . Asthma Neg Hx   . Cancer Neg Hx   . Diabetes Neg Hx   . Heart disease Neg Hx   . Stroke Neg Hx     Social History Social History   Tobacco Use  . Smoking status: Former Research scientist (life sciences)  . Smokeless tobacco: Never Used  . Tobacco comment: 2001  Substance Use Topics  . Alcohol use: Yes    Comment: once a wee; not with pregk  . Drug use: No     Allergies   Patient has no known allergies.   Review of Systems Review of Systems  All other systems reviewed and are negative.    Physical Exam Updated Vital Signs BP 113/81   Pulse 78   Temp 98.9 F (37.2 C) (Oral)   Resp 12   Ht 1.854 m (6\' 1" )   Wt 70.3 kg   SpO2 100%   Breastfeeding Unknown   BMI 20.45 kg/m   Physical Exam Vitals  signs and nursing note reviewed.  Constitutional:      General: She is not in acute distress.    Appearance: Normal appearance. She is well-developed. She is not toxic-appearing.  HENT:     Head: Normocephalic and atraumatic.  Eyes:     General: Lids are normal.     Conjunctiva/sclera: Conjunctivae normal.     Pupils: Pupils are equal, round, and reactive to light.  Neck:     Musculoskeletal: Normal range of motion and neck supple.     Thyroid: No thyroid mass.     Trachea: No tracheal deviation.  Cardiovascular:     Rate and Rhythm: Normal rate and regular rhythm.     Heart sounds: Normal heart sounds. No murmur. No gallop.   Pulmonary:     Effort: Pulmonary effort is normal. No respiratory distress.     Breath sounds: Normal breath sounds. No stridor. No decreased breath sounds, wheezing, rhonchi or rales.  Abdominal:     General: Bowel sounds are normal. There is no distension.     Palpations: Abdomen is soft.     Tenderness: There is abdominal tenderness in the left upper quadrant and left lower quadrant. There is guarding. There is no rebound.    Musculoskeletal: Normal range of motion.        General: No tenderness.  Skin:    General: Skin is warm and dry.     Findings: No abrasion or rash.  Neurological:     Mental Status: She is alert and oriented to person, place, and time.     GCS: GCS eye subscore is 4. GCS verbal subscore is 5. GCS motor subscore is 6.     Cranial Nerves: No cranial nerve deficit.     Sensory: No sensory deficit.     Motor: No tremor.     Coordination: Finger-Nose-Finger Test normal.     Gait: Gait normal.  Psychiatric:        Speech: Speech normal.        Behavior: Behavior normal.      ED Treatments / Results  Labs (all labs ordered are listed, but only abnormal results are displayed) Labs Reviewed  CBC WITH DIFFERENTIAL/PLATELET  COMPREHENSIVE METABOLIC PANEL  LIPASE, BLOOD  I-STAT BETA HCG BLOOD, ED ([facility], [facility], [facility] ONLY)    EKG EKG  Interpretation  Date/Time:  Thursday September 18 2018 18:43:34 EDT Ventricular Rate:  71 PR Interval:    QRS Duration: 93 QT Interval:  389 QTC Calculation: 423 R Axis:   77 Text Interpretation:  Sinus rhythm Confirmed by Lorre NickAllen, Jarreau Callanan ([id number]) on 09/18/2018 7:14:17 PM   Radiology No results found.  Procedures Procedures (including critical care time)  Medications Ordered in ED Medications - No data to display   Initial Impression / Assessment and Plan / ED Course  I have reviewed the triage vital signs and the nursing notes.  Pertinent labs & imaging results that were available during my care of the patient were reviewed by me and considered in my medical decision making (see chart for details).        Patient's work-up here is negative including head CT labs blood work.  Will give referral to our neurology for her subjective pain and paresthesias.  Discussed with the patient this could be MS and she will need further evaluation.  Final Clinical Impressions(s) / ED Diagnoses   Final diagnoses:  None    ED Discharge Orders    None       Lorre NickAllen, Aleea Hendry, MD 09/18/18 Angelene [id number]    Lorre NickAllen, Trea Latner, MD 09/18/18 1914

## 2018-10-01 ENCOUNTER — Other Ambulatory Visit: Payer: Self-pay

## 2018-10-01 ENCOUNTER — Encounter: Payer: Self-pay | Admitting: Neurology

## 2018-10-01 ENCOUNTER — Ambulatory Visit: Payer: 59 | Admitting: Neurology

## 2018-10-01 VITALS — BP 112/72 | HR 76 | Temp 97.8°F | Ht 73.0 in | Wt 151.0 lb

## 2018-10-01 DIAGNOSIS — R51 Headache: Secondary | ICD-10-CM

## 2018-10-01 DIAGNOSIS — R200 Anesthesia of skin: Secondary | ICD-10-CM

## 2018-10-01 DIAGNOSIS — R5382 Chronic fatigue, unspecified: Secondary | ICD-10-CM

## 2018-10-01 DIAGNOSIS — R29898 Other symptoms and signs involving the musculoskeletal system: Secondary | ICD-10-CM

## 2018-10-01 DIAGNOSIS — R42 Dizziness and giddiness: Secondary | ICD-10-CM

## 2018-10-01 DIAGNOSIS — R55 Syncope and collapse: Secondary | ICD-10-CM | POA: Diagnosis not present

## 2018-10-01 DIAGNOSIS — R202 Paresthesia of skin: Secondary | ICD-10-CM

## 2018-10-01 DIAGNOSIS — R29818 Other symptoms and signs involving the nervous system: Secondary | ICD-10-CM

## 2018-10-01 DIAGNOSIS — R531 Weakness: Secondary | ICD-10-CM

## 2018-10-01 DIAGNOSIS — R519 Headache, unspecified: Secondary | ICD-10-CM

## 2018-10-01 MED ORDER — CYCLOBENZAPRINE HCL 10 MG PO TABS: 10.0000 mg | ORAL_TABLET | Freq: Three times a day (TID) | ORAL | 3 refills | Status: DC | PRN

## 2018-10-01 MED ORDER — ALPRAZOLAM 0.25 MG PO TABS: ORAL_TABLET | ORAL | 0 refills | Status: DC

## 2018-10-01 NOTE — Progress Notes (Signed)
[facility]    Provider:  Dr Lucia GaskinsAhern Requesting Provider: Emergency room Primary Care Provider:  Patient, No Pcp Per  CC:  Multiple neurologic symptoms  HPI:  Michelle [name] is a 38 y.o. female here as requested by Ethelda ChickSmith, Kristi M, MD for paresthesias. She had an episode of 1 week of pain radiating from the left hip don to the leg, numbness and tingling in the back part of the thigh, started 6/5-11/2018, never had anything like this before. She woke up normal and all of a sudden happened, no inciting events, no falls, same 'ol day, no falls, she also started having pain and tingling in the left arm on 6/11 in the forearm and moved into the fingers. The leg pain continued for about 2 weeks, continuous, the left arm pain was intermittent, worse during the day at work. By the 18th her leg felt better, but then started back up started feeling nausea, had the pain in the leg, vomited at work, went to urgent care and she went to the ED 6/18 for neck pain, left arm and left le pain, "whole entrie body aching" from neck to her feet, pain shooting pains with flexion of the head. No rashes. No vision changes. Symptoms worsen and then improve over a few week periods.Ongoing the last year. She has been to the ED and followed with pcp for a year.   Reviewed notes, labs and imaging from outside physicians, which showed:  09/18/2018: Ct showed No acute intracranial abnormalities including mass lesion or mass effect, hydrocephalus, extra-axial fluid collection, midline shift, hemorrhage, or acute infarction, large ischemic events (personally reviewed images)  Cbc/cmp normal   Review of Systems: Patient complains of symptoms per HPI as well as the following symptoms: fatigue, weakness, numbness, leg pain, neck pain, low back pain. Pertinent negatives and positives per HPI. All others negative.   Social History   Socioeconomic History  . Marital status: Divorced    Spouse name: Not on file   . Number of children: 5  . Years of education: college   . Highest education level: Not on file  Occupational History  . Not on file  Social Needs  . Financial resource strain: Not on file  . Food insecurity    Worry: Not on file    Inability: Not on file  . Transportation needs    Medical: Not on file    Non-medical: Not on file  Tobacco Use  . Smoking status: Former Games developermoker  . Smokeless tobacco: Never Used  . Tobacco comment: 2001, smoked as a teen for about 1-2 years  Substance and Sexual Activity  . Alcohol use: Yes    Comment: few glasses of wine a week, social  . Drug use: No  . Sexual activity: Yes    Partners: Male    Birth control/protection: None  Lifestyle  . Physical activity    Days per week: Not on file    Minutes per session: Not on file  . Stress: Not on file  Relationships  . Social Musicianconnections    Talks on phone: Not on file    Gets together: Not on file    Attends religious service: Not on file    Active member of club or organization: Not on file    Attends meetings of clubs or organizations: Not on file    Relationship status: Not on file  . Intimate partner violence    Fear of current or ex partner: Not on file  Emotionally abused: Not on file    Physically abused: Not on file    Forced sexual activity: Not on file  Other Topics Concern  . Not on file  Social History Narrative   Lives at home with her children   Right handed   Caffeine: maybe 1-2 cups, not daily.         ** Merged History Encounter **        Family History  Problem Relation Age of Onset  . Hypertension Maternal Grandmother   . Alzheimer's disease Maternal Grandmother   . Seizures Brother   . Hearing loss Son   . Hypertension Maternal Grandfather        patient doesn't recall this   . Ovarian cancer Maternal Aunt        remission  . Anesthesia problems Neg Hx   . Hypotension Neg Hx   . Malignant hyperthermia Neg Hx   . Pseudochol deficiency Neg Hx   . Asthma  Neg Hx   . Cancer Neg Hx   . Diabetes Neg Hx   . Heart disease Neg Hx   . Stroke Neg Hx   . Neuropathy Neg Hx     Past Medical History:  Diagnosis Date  . Infection    UTI  . Preeclampsia    after giving birth, resolved.  . Preterm labor   . Urinary tract infection     Patient Active Problem List   Diagnosis Date Noted  . Nexplanon removal 09/29/2012  . UNSPECIFIED ANEMIA 03/28/2010  . FATTY LIVER DISEASE 03/28/2010  . OVARIAN CYST 03/28/2010  . DYSMENORRHEA 03/28/2010    Past Surgical History:  Procedure Laterality Date  . AUGMENTATION MAMMAPLASTY    . BREAST ENHANCEMENT SURGERY  06/2010    Current Outpatient Medications  Medication Sig Dispense Refill  . ALPRAZolam (XANAX) 0.25 MG tablet Take 1-2 tabs (0.25mg -0.50mg ) 30-60 minutes before procedure. May repeat if needed.Do not drive. 4 tablet 0  . cyclobenzaprine (FLEXERIL) 10 MG tablet Take 1 tablet (10 mg total) by mouth 3 (three) times daily as needed for muscle spasms. 90 tablet 3   No current facility-administered medications for this visit.     Allergies as of 10/01/2018  . (No Known Allergies)    Vitals: BP 112/72 (BP Location: Right Arm, Patient Position: Sitting)   Pulse 76   Temp 97.8 F (36.6 C)   Ht 6\' 1"  (1.854 m)   Wt 151 lb (68.5 kg)   BMI 19.92 kg/m  Last Weight:  Wt Readings from Last 1 Encounters:  10/01/18 151 lb (68.5 kg)   Last Height:   Ht Readings from Last 1 Encounters:  10/01/18 6\' 1"  (1.854 m)     Physical exam: Exam: Gen: NAD, conversant, well nourised, well groomed                     CV: RRR, no MRG. No Carotid Bruits. No peripheral edema, warm, nontender Eyes: Conjunctivae clear without exudates or hemorrhage  Neuro: Detailed Neurologic Exam  Speech:    Speech is normal; fluent and spontaneous with normal comprehension.  Cognition:    The patient is oriented to person, place, and time;     recent and remote memory intact;     language fluent;     normal  attention, concentration,     fund of knowledge Cranial Nerves:    The pupils are equal, round, and reactive to light. The fundi are normal and spontaneous venous pulsations are  present. Visual fields are full to finger confrontation. Extraocular movements are intact. Trigeminal sensation is intact and the muscles of mastication are normal. The face is symmetric. The palate elevates in the midline. Hearing intact. Voice is normal. Shoulder shrug is normal. The tongue has normal motion without fasciculations.   Coordination:    Normal finger to nose and heel to shin. Normal rapid alternating movements.   Gait:    Heel-toe and tandem gait are normal.   Motor Observation:    No asymmetry, no atrophy, and no involuntary movements noted. Tone:    Normal muscle tone.    Posture:    Posture is normal. normal erect    Strength: left hip flexion and leg flexion 4/5. Otherwise    Strength is V/V in the upper and lower limbs.      Sensation: decreased right arm and leg when compared to the left.      Reflex Exam:  DTR's: right AJ slightly brisker otherwise deep tendon reflexes in the upper and lower extremities are normal bilaterally.   Toes:    The toes are downgoing bilaterally.   Clonus:    2 beats right AJ (may be normal at her age)     Assessment/Plan:  She has episodes of neurologic issues, left-sided numbness and weakness and paresthesias, decribes possible Lheurmitte's sign, left arm and leg symptoms, neck pain and shooting electric pains on movement. Symptoms wa and wane, cramping and curling of the feet, painful extremities. Also focal findings on exam such as asymmetric reflex, 2 beats clonus, left leg weakness. She needs an MRi of the brain and cervical spine to evaluate for syndromes such as multiple sclerosis.   Flexeril neck pain and headache Needs a pcp  Orders Placed This Encounter  Procedures  . MR BRAIN W WO CONTRAST  . MR CERVICAL SPINE W WO CONTRAST  . Vitamin D,  25-hydroxy  . B12 and Folate Panel  . Methylmalonic acid, serum  . TSH  . Vitamin B1   Meds ordered this encounter  Medications  . cyclobenzaprine (FLEXERIL) 10 MG tablet    Sig: Take 1 tablet (10 mg total) by mouth 3 (three) times daily as needed for muscle spasms.    Dispense:  90 tablet    Refill:  3  . ALPRAZolam (XANAX) 0.25 MG tablet    Sig: Take 1-2 tabs (0.25mg -0.50mg ) 30-60 minutes before procedure. May repeat if needed.Do not drive.    Dispense:  4 tablet    Refill:  0    Sarina Ill, MD  [facility] [address] [place], [place] [address]  Phone [phone] Fax [phone]

## 2018-10-01 NOTE — Patient Instructions (Addendum)
Labs today MRI of the brain and cervical spine Xanax before MRI Flexeril as needed for neck pain Call the office when you want to come back  Cyclobenzaprine tablets What is this medicine? CYCLOBENZAPRINE (sye kloe BEN za preen) is a muscle relaxer. It is used to treat muscle pain, spasms, and stiffness. This medicine may be used for other purposes; ask your health care provider or pharmacist if you have questions. COMMON BRAND NAME(S): Fexmid, Flexeril What should I tell my health care provider before I take this medicine? They need to know if you have any of these conditions:  heart disease, irregular heartbeat, or previous heart attack  liver disease  thyroid problem  an unusual or allergic reaction to cyclobenzaprine, tricyclic antidepressants, lactose, other medicines, foods, dyes, or preservatives  pregnant or trying to get pregnant  breast-feeding How should I use this medicine? Take this medicine by mouth with a glass of water. Follow the directions on the prescription label. If this medicine upsets your stomach, take it with food or milk. Take your medicine at regular intervals. Do not take it more often than directed. Talk to your pediatrician regarding the use of this medicine in children. Special care may be needed. Overdosage: If you think you have taken too much of this medicine contact a poison control center or emergency room at once. NOTE: This medicine is only for you. Do not share this medicine with others. What if I miss a dose? If you miss a dose, take it as soon as you can. If it is almost time for your next dose, take only that dose. Do not take double or extra doses. What may interact with this medicine? Do not take this medicine with any of the following medications:  MAOIs like Carbex, Eldepryl, Marplan, Nardil, and Parnate  narcotic medicines for cough  safinamide This medicine may also interact with the following  medications:  alcohol  bupropion  antihistamines for allergy, cough and cold  certain medicines for anxiety or sleep  certain medicines for bladder problems like oxybutynin, tolterodine  certain medicines for depression like amitriptyline, fluoxetine, sertraline  certain medicines for Parkinson's disease like benztropine, trihexyphenidyl  certain medicines for seizures like phenobarbital, primidone  certain medicines for stomach problems like dicyclomine, hyoscyamine  certain medicines for travel sickness like scopolamine  general anesthetics like halothane, isoflurane, methoxyflurane, propofol  ipratropium  local anesthetics like lidocaine, pramoxine, tetracaine  medicines that relax muscles for surgery  narcotic medicines for pain  phenothiazines like chlorpromazine, mesoridazine, prochlorperazine, thioridazine  verapamil This list may not describe all possible interactions. Give your health care provider a list of all the medicines, herbs, non-prescription drugs, or dietary supplements you use. Also tell them if you smoke, drink alcohol, or use illegal drugs. Some items may interact with your medicine. What should I watch for while using this medicine? Tell your doctor or health care professional if your symptoms do not start to get better or if they get worse. You may get drowsy or dizzy. Do not drive, use machinery, or do anything that needs mental alertness until you know how this medicine affects you. Do not stand or sit up quickly, especially if you are an older patient. This reduces the risk of dizzy or fainting spells. Alcohol may interfere with the effect of this medicine. Avoid alcoholic drinks. If you are taking another medicine that also causes drowsiness, you may have more side effects. Give your health care provider a list of all medicines you use. Your  doctor will tell you how much medicine to take. Do not take more medicine than directed. Call emergency for  help if you have problems breathing or unusual sleepiness. Your mouth may get dry. Chewing sugarless gum or sucking hard candy, and drinking plenty of water may help. Contact your doctor if the problem does not go away or is severe. What side effects may I notice from receiving this medicine? Side effects that you should report to your doctor or health care professional as soon as possible:  allergic reactions like skin rash, itching or hives, swelling of the face, lips, or tongue  breathing problems  chest pain  fast, irregular heartbeat  hallucinations  seizures  unusually weak or tired Side effects that usually do not require medical attention (report to your doctor or health care professional if they continue or are bothersome):  headache  nausea, vomiting This list may not describe all possible side effects. Call your doctor for medical advice about side effects. You may report side effects to FDA at 1-800-FDA-1088. Where should I keep my medicine? Keep out of the reach of children. Store at room temperature between 15 and 30 degrees C (59 and 86 degrees F). Keep container tightly closed. Throw away any unused medicine after the expiration date. NOTE: This sheet is a summary. It may not cover all possible information. If you have questions about this medicine, talk to your doctor, pharmacist, or health care provider.  2020 Elsevier/Gold Standard (2018-02-19 12:49:26)  Alprazolam tablets What is this medicine? ALPRAZOLAM (al PRAY zoe lam) is a benzodiazepine. It is used to treat anxiety and panic attacks. This medicine may be used for other purposes; ask your health care provider or pharmacist if you have questions. COMMON BRAND NAME(S): Xanax What should I tell my health care provider before I take this medicine? They need to know if you have any of these conditions:  an alcohol or drug abuse problem  bipolar disorder, depression, psychosis or other mental health  conditions  glaucoma  kidney or liver disease  lung or breathing disease  myasthenia gravis  Parkinson's disease  porphyria  seizures or a history of seizures  suicidal thoughts  an unusual or allergic reaction to alprazolam, other benzodiazepines, foods, dyes, or preservatives  pregnant or trying to get pregnant  breast-feeding How should I use this medicine? Take this medicine by mouth with a glass of water. Follow the directions on the prescription label. Take your medicine at regular intervals. Do not take it more often than directed. Do not stop taking except on your doctor's advice. A special MedGuide will be given to you by the pharmacist with each prescription and refill. Be sure to read this information carefully each time. Talk to your pediatrician regarding the use of this medicine in children. Special care may be needed. Overdosage: If you think you have taken too much of this medicine contact a poison control center or emergency room at once. NOTE: This medicine is only for you. Do not share this medicine with others. What if I miss a dose? If you miss a dose, take it as soon as you can. If it is almost time for your next dose, take only that dose. Do not take double or extra doses. What may interact with this medicine? Do not take this medicine with any of the following medications:  certain antiviral medicines for HIV or AIDS like delavirdine, indinavir  certain medicines for fungal infections like ketoconazole and itraconazole  narcotic medicines for  cough  sodium oxybate This medicine may also interact with the following medications:  alcohol  antihistamines for allergy, cough and cold  certain antibiotics like clarithromycin, erythromycin, isoniazid, rifampin, rifapentine, rifabutin, and troleandomycin  certain medicines for blood pressure, heart disease, irregular heart beat  certain medicines for depression, like amitriptyline, fluoxetine,  sertraline  certain medicines for seizures like carbamazepine, oxcarbazepine, phenobarbital, phenytoin, primidone  cimetidine  cyclosporine  female hormones, like estrogens or progestins and birth control pills, patches, rings, or injections  general anesthetics like halothane, isoflurane, methoxyflurane, propofol  grapefruit juice  local anesthetics like lidocaine, pramoxine, tetracaine  medicines that relax muscles for surgery  narcotic medicines for pain  other antiviral medicines for HIV or AIDS  phenothiazines like chlorpromazine, mesoridazine, prochlorperazine, thioridazine This list may not describe all possible interactions. Give your health care provider a list of all the medicines, herbs, non-prescription drugs, or dietary supplements you use. Also tell them if you smoke, drink alcohol, or use illegal drugs. Some items may interact with your medicine. What should I watch for while using this medicine? Tell your doctor or health care professional if your symptoms do not start to get better or if they get worse. Do not stop taking except on your doctor's advice. You may develop a severe reaction. Your doctor will tell you how much medicine to take. You may get drowsy or dizzy. Do not drive, use machinery, or do anything that needs mental alertness until you know how this medicine affects you. To reduce the risk of dizzy and fainting spells, do not stand or sit up quickly, especially if you are an older patient. Alcohol may increase dizziness and drowsiness. Avoid alcoholic drinks. If you are taking another medicine that also causes drowsiness, you may have more side effects. Give your health care provider a list of all medicines you use. Your doctor will tell you how much medicine to take. Do not take more medicine than directed. Call emergency for help if you have problems breathing or unusual sleepiness. What side effects may I notice from receiving this medicine? Side effects  that you should report to your doctor or health care professional as soon as possible:  allergic reactions like skin rash, itching or hives, swelling of the face, lips, or tongue  breathing problems  confusion  loss of balance or coordination  signs and symptoms of low blood pressure like dizziness; feeling faint or lightheaded, falls; unusually weak or tired  suicidal thoughts or other mood changes Side effects that usually do not require medical attention (report to your doctor or health care professional if they continue or are bothersome):  dizziness  dry mouth  nausea, vomiting  tiredness This list may not describe all possible side effects. Call your doctor for medical advice about side effects. You may report side effects to FDA at 1-800-FDA-1088. Where should I keep my medicine? Keep out of the reach of children. This medicine can be abused. Keep your medicine in a safe place to protect it from theft. Do not share this medicine with anyone. Selling or giving away this medicine is dangerous and against the law. Store at room temperature between 20 and 25 degrees C (68 and 77 degrees F). This medicine may cause accidental overdose and death if taken by other adults, children, or pets. Mix any unused medicine with a substance like cat litter or coffee grounds. Then throw the medicine away in a sealed container like a sealed bag or a coffee can with a lid.  Do not use the medicine after the expiration date. NOTE: This sheet is a summary. It may not cover all possible information. If you have questions about this medicine, talk to your doctor, pharmacist, or health care provider.  2020 Elsevier/Gold Standard (2014-12-16 13:47:25)

## 2018-10-02 ENCOUNTER — Telehealth: Payer: Self-pay | Admitting: Neurology

## 2018-10-02 NOTE — Telephone Encounter (Signed)
I spoke to the patient due to the cost she is going to think about. I did offer her the payment plan.  [place]: [phone] & [phone] (exp. 10/02/18 to 11/16/18)

## 2018-10-08 ENCOUNTER — Telehealth: Payer: Self-pay | Admitting: Neurology

## 2018-10-08 NOTE — Telephone Encounter (Signed)
Called the patient to inform her of the lab results. Mailbox was full and unable to leave a message.

## 2018-10-08 NOTE — Telephone Encounter (Signed)
-----   Message from Melvenia Beam, MD sent at 10/05/2018  7:35 PM EDT ----- Vitamin D is very low(17, minimum is 30 and like to see it closer to 40 or 50). I recommend 1000-2000 IU a day long term and discuss with pcp thanks.

## 2018-10-09 NOTE — Telephone Encounter (Signed)
Reached out to pt (#2 attempt). Received no answer and VM box was full.

## 2018-10-11 LAB — METHYLMALONIC ACID, SERUM: Methylmalonic Acid: 93 nmol/L (ref 0–378)

## 2018-10-11 LAB — TSH: TSH: 0.835 u[IU]/mL (ref 0.450–4.500)

## 2018-10-11 LAB — VITAMIN D 25 HYDROXY (VIT D DEFICIENCY, FRACTURES): Vit D, 25-Hydroxy: 17 ng/mL — ABNORMAL LOW (ref 30.0–100.0)

## 2018-10-11 LAB — VITAMIN B1: Thiamine: 73.4 nmol/L (ref 66.5–200.0)

## 2018-10-11 LAB — B12 AND FOLATE PANEL
Folate: 11.1 ng/mL (ref >3.0)
Vitamin B-12: 410 pg/mL (ref 232–1245)

## 2018-10-14 NOTE — Telephone Encounter (Signed)
Tried to reach pt a third time. Received no answer and vm box was full. Letter sent to pt asking for call back as soon as possible to discuss lab results.

## 2018-12-01 NOTE — Telephone Encounter (Signed)
Pt returned my call. I discussed the Vitamin D lab results noted below. Pt aware her level is 17, minimum is 30 but Dr. Jaynee Eagles would like to see closer to 27 or 87. Pt advised of Dr. Cathren Laine recommendation to start taking a daily long term supplement of Vitamin D 1000-2000 int'l units daily which can be purchased OTC. Pt aware she needs to discuss with PCP for long term management. She stated she would establish care with a PCP and will let us know when she does. She asked for the lab to be sent to her. Pt aware request will be sent to medical records. She verbalized appreciation.

## 2018-12-01 NOTE — Telephone Encounter (Signed)
I tried to call the patient back. Received no answer and her vm box was full.

## 2018-12-01 NOTE — Telephone Encounter (Signed)
Called pt for reminder call- pt wished to c/a app and leave a message to have RN call to go over lab results. Please advise

## 2018-12-02 ENCOUNTER — Ambulatory Visit: Payer: Self-pay | Admitting: Neurology

## 2019-04-15 ENCOUNTER — Ambulatory Visit: Payer: 59 | Attending: Internal Medicine

## 2019-04-15 DIAGNOSIS — Z20822 Contact with and (suspected) exposure to covid-19: Secondary | ICD-10-CM

## 2019-04-17 ENCOUNTER — Ambulatory Visit (INDEPENDENT_AMBULATORY_CARE_PROVIDER_SITE_OTHER)
Admission: RE | Admit: 2019-04-17 | Discharge: 2019-04-17 | Disposition: A | Discharge status: Home / Self Care | Payer: 59 | Source: Ambulatory Visit

## 2019-04-17 DIAGNOSIS — R05 Cough: Secondary | ICD-10-CM | POA: Diagnosis not present

## 2019-04-17 DIAGNOSIS — J019 Acute sinusitis, unspecified: Secondary | ICD-10-CM | POA: Diagnosis not present

## 2019-04-17 DIAGNOSIS — U071 COVID-19: Secondary | ICD-10-CM

## 2019-04-17 LAB — NOVEL CORONAVIRUS, NAA: SARS-CoV-2, NAA: DETECTED — AB

## 2019-04-17 MED ORDER — FLUTICASONE PROPIONATE 50 MCG/ACT NA SUSP: 1.0000 | Freq: Every day | NASAL | 0 refills | Status: DC

## 2019-04-17 MED ORDER — BENZONATATE 100 MG PO CAPS: 100.0000 mg | ORAL_CAPSULE | Freq: Three times a day (TID) | ORAL | 0 refills | Status: DC

## 2019-04-17 MED ORDER — ZYRTEC ALLERGY 10 MG PO CAPS: 10.0000 mg | ORAL_CAPSULE | ORAL | 0 refills | Status: DC

## 2019-04-17 NOTE — Discharge Instructions (Addendum)
  You should remain isolated in your home for 10 days from symptom onset AND greater than 72 hours after symptoms resolution (absence of fever without the use of fever-reducing medication and improvement in respiratory symptoms), whichever is longer Get plenty of rest and push fluids Tessalon Perles prescribed for cough Zyrtec prescribed for nasal congestion, runny nose, and/or sore throat Flonase prescribed for nasal congestion and runny nose Use medications daily for symptom relief Use OTC medications like ibuprofen or tylenol as needed fever or pain Call or go to the ED if you have any new or worsening symptoms such as fever, worsening cough, shortness of breath, chest tightness, chest pain, turning blue, changes in mental status, etc..Marland Kitchen

## 2019-04-17 NOTE — ED Provider Notes (Signed)
Virtual Visit via Video Note:  Michelle [name]  initiated request for Telemedicine visit with [facility] team. I connected with Glennie Hawk  on 04/17/2019 at 6:37 PM  for a synchronized telemedicine visit using a video enabled HIPPA compliant telemedicine application. I verified that I am speaking with Michelle [name]  using two identifiers. Durward Parcel, FNP  was physically located in a [facility] Urgent care site and Alan CHRISTAIN MCRANEY was located at a different location.   The limitations of evaluation and management by telemedicine as well as the availability of in-person appointments were discussed. Patient was informed that she  may incur a bill ( including co-pay) for this virtual visit encounter. Michelle [name]  expressed understanding and gave verbal consent to proceed with virtual visit.     History of Present Illness:Michelle [name]  is a 39 y.o. female presents who presents sinus pressure, sinus pain and cough, for the past 2 days.  Reported she tested positive for COVID-19 on 04/15/2019.Denies sick exposure to flu or strep.  Denies recent travel.  Denies aggravating or alleviating symptoms.  Denies previous COVID infection.   Denies fever, chills, fatigue, nasal congestion, rhinorrhea, sore throat, cough, SOB, wheezing, chest pain, nausea, vomiting, changes in bowel or bladder habits.     Past Medical History:  Diagnosis Date  . Infection    UTI  . Preeclampsia    after giving birth, resolved.  . Preterm labor   . Urinary tract infection     No Known Allergies      Observations/Objective:VITALS: Per patient if applicable, see vitals. GENERAL: Alert, appears well and in no acute distress. HEENT: Atraumatic, conjunctiva clear, no obvious abnormalities on inspection of external nose and ears. NECK: Normal movements of the head and neck. CARDIOPULMONARY: No increased WOB. Speaking in clear sentences. I:E ratio WNL.  MS: Moves all visible extremities without  noticeable abnormality. PSYCH: Pleasant and cooperative, well-groomed. Speech normal rate and rhythm. Affect is appropriate. Insight and judgement are appropriate. Attention is focused, linear, and appropriate.  NEURO: CN grossly intact. Oriented as arrived to appointment on time with no prompting. Moves both UE equally.  SKIN: No obvious lesions, wounds, erythema, or cyanosis noted on face or hands.     Assessment and Plan:  Advised patient to remain quarantine Flonase prescribed for sinusitis Tessalon prescribed for cough Zyrtec prescribed for sinusitis To go to ED for worsening symptoms  Follow Up Instructions: Advised patient to follow-up in 2 to 3 days with primary care via video.   I discussed the assessment and treatment plan with the patient. The patient was provided an opportunity to ask questions and all were answered. The patient agreed with the plan and demonstrated an understanding of the instructions.   The patient was advised to call back or seek an in-person evaluation if the symptoms worsen or if the condition fails to improve as anticipated.  I provided 15 minutes of non-face-to-face time during this encounter.    Durward Parcel, FNP  04/17/2019 6:37 PM         Durward Parcel, FNP 04/17/19 1837

## 2019-09-11 ENCOUNTER — Encounter (HOSPITAL_COMMUNITY): Payer: Self-pay | Admitting: Emergency Medicine

## 2019-09-11 ENCOUNTER — Emergency Department (HOSPITAL_COMMUNITY)
Admission: EM | Admit: 2019-09-11 | Discharge: 2019-09-12 | Disposition: A | Discharge status: Home / Self Care | Payer: 59 | Attending: Emergency Medicine | Admitting: Emergency Medicine

## 2019-09-11 ENCOUNTER — Other Ambulatory Visit: Payer: Self-pay

## 2019-09-11 DIAGNOSIS — Z5321 Procedure and treatment not carried out due to patient leaving prior to being seen by health care provider: Secondary | ICD-10-CM | POA: Insufficient documentation

## 2019-09-11 DIAGNOSIS — M549 Dorsalgia, unspecified: Secondary | ICD-10-CM | POA: Insufficient documentation

## 2019-09-11 DIAGNOSIS — M542 Cervicalgia: Secondary | ICD-10-CM | POA: Insufficient documentation
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
## 2019-09-11 NOTE — ED Notes (Signed)
Pt called 3x to bring to assigned room, to no response. This NT called the pt's phone number and communicated that room was available for pt. Pt stated that she returned home with her children and will admit herself again to the [place] ED tomorrow.

## 2019-09-12 ENCOUNTER — Encounter (HOSPITAL_COMMUNITY): Payer: Self-pay

## 2019-09-12 ENCOUNTER — Emergency Department (HOSPITAL_COMMUNITY): Payer: 59

## 2019-09-12 ENCOUNTER — Other Ambulatory Visit: Payer: Self-pay

## 2019-09-12 ENCOUNTER — Emergency Department (HOSPITAL_COMMUNITY)
Admission: EM | Admit: 2019-09-12 | Discharge: 2019-09-12 | Disposition: A | Discharge status: Home / Self Care | Payer: 59 | Attending: Emergency Medicine | Admitting: Emergency Medicine

## 2019-09-12 DIAGNOSIS — Z87891 Personal history of nicotine dependence: Secondary | ICD-10-CM | POA: Diagnosis not present

## 2019-09-12 DIAGNOSIS — O034 Incomplete spontaneous abortion without complication: Secondary | ICD-10-CM

## 2019-09-12 DIAGNOSIS — S161XXA Strain of muscle, fascia and tendon at neck level, initial encounter: Secondary | ICD-10-CM | POA: Insufficient documentation

## 2019-09-12 DIAGNOSIS — Y92410 Unspecified street and highway as the place of occurrence of the external cause: Secondary | ICD-10-CM | POA: Diagnosis not present

## 2019-09-12 DIAGNOSIS — S8001XA Contusion of right knee, initial encounter: Secondary | ICD-10-CM | POA: Diagnosis not present

## 2019-09-12 DIAGNOSIS — S199XXA Unspecified injury of neck, initial encounter: Secondary | ICD-10-CM | POA: Diagnosis present

## 2019-09-12 DIAGNOSIS — Y9389 Activity, other specified: Secondary | ICD-10-CM | POA: Insufficient documentation

## 2019-09-12 DIAGNOSIS — M25552 Pain in left hip: Secondary | ICD-10-CM | POA: Insufficient documentation

## 2019-09-12 DIAGNOSIS — Y998 Other external cause status: Secondary | ICD-10-CM | POA: Insufficient documentation

## 2019-09-12 DIAGNOSIS — R52 Pain, unspecified: Secondary | ICD-10-CM

## 2019-09-12 DIAGNOSIS — R519 Headache, unspecified: Secondary | ICD-10-CM | POA: Insufficient documentation

## 2019-09-12 LAB — CBC WITH DIFFERENTIAL/PLATELET
Abs Immature Granulocytes: 0.01 10*3/uL (ref 0.00–0.07)
Basophils Absolute: 0 10*3/uL (ref 0.0–0.1)
Basophils Relative: 1 %
Eosinophils Absolute: 0 10*3/uL (ref 0.0–0.5)
Eosinophils Relative: 1 %
HCT: 33.6 % — ABNORMAL LOW (ref 36.0–46.0)
Hemoglobin: 11.4 g/dL — ABNORMAL LOW (ref 12.0–15.0)
Immature Granulocytes: 0 %
Lymphocytes Relative: 34 %
Lymphs Abs: 1.9 10*3/uL (ref 0.7–4.0)
MCH: 27.9 pg (ref 26.0–34.0)
MCHC: 33.9 g/dL (ref 30.0–36.0)
MCV: 82.4 fL (ref 80.0–100.0)
Monocytes Absolute: 0.3 10*3/uL (ref 0.1–1.0)
Monocytes Relative: 5 %
Neutro Abs: 3.3 10*3/uL (ref 1.7–7.7)
Neutrophils Relative %: 59 %
Platelets: 386 10*3/uL (ref 150–400)
RBC: 4.08 MIL/uL (ref 3.87–5.11)
RDW: 13.6 % (ref 11.5–15.5)
WBC: 5.5 10*3/uL (ref 4.0–10.5)
nRBC: 0 % (ref 0.0–0.2)

## 2019-09-12 LAB — I-STAT BETA HCG BLOOD, ED (MC, WL, AP ONLY): I-stat hCG, quantitative: 620.8 m[IU]/mL — ABNORMAL HIGH (ref <5)

## 2019-09-12 LAB — POC URINE PREG, ED: Preg Test, Ur: POSITIVE — AB

## 2019-09-12 MED ORDER — CYCLOBENZAPRINE HCL 10 MG PO TABS
10.0000 mg | ORAL_TABLET | Freq: Once | ORAL | Status: AC
  Administered 2019-09-12: 10 mg via ORAL
  Filled 2019-09-12: qty 1

## 2019-09-12 MED ORDER — ACETAMINOPHEN 325 MG PO TABS
650.0000 mg | ORAL_TABLET | Freq: Once | ORAL | Status: AC
  Administered 2019-09-12: 650 mg via ORAL
  Filled 2019-09-12: qty 2

## 2019-09-12 MED ORDER — HYDROCODONE-ACETAMINOPHEN 5-325 MG PO TABS: 1.0000 | ORAL_TABLET | ORAL | 0 refills | Status: DC | PRN

## 2019-09-12 MED ORDER — CYCLOBENZAPRINE HCL 10 MG PO TABS
10.0000 mg | ORAL_TABLET | Freq: Two times a day (BID) | ORAL | 0 refills | Status: DC | PRN
Start: 2019-09-12 — End: 2021-10-13
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
## 2019-09-12 NOTE — Discharge Instructions (Signed)
Follow up with the women's clinic on Monday for blood work as discussed. The clinic should call to schedule this appointment.   Take Flexeril and Norco as needed as prescribed for pain. These medications can cause constipation, take Colace as needed as directed.  Warm compresses and gentle stretching for sore muscles. Follow up with your doctor for recheck. Return to the ER for new or worsening symptoms.
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
## 2019-09-15 ENCOUNTER — Telehealth: Payer: Self-pay | Admitting: Family Medicine

## 2019-09-15 NOTE — Telephone Encounter (Signed)
Spoke with Michelle [name] about scheduling an appointment. She stated she tried to reach [place] on the phone for two hours, and was not able to reach anyone. She stated she would come in on Wednesday to get her blood drawn. I apologized for not being able to get her in sooner.

## 2019-09-16 ENCOUNTER — Ambulatory Visit: Payer: 59
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
## 2021-06-28 ENCOUNTER — Ambulatory Visit
Admission: EM | Admit: 2021-06-28 | Discharge: 2021-06-28 | Disposition: A | Discharge status: Other Acute Inpatient Hospital | Payer: 59

## 2021-06-28 ENCOUNTER — Other Ambulatory Visit: Payer: Self-pay

## 2021-10-13 ENCOUNTER — Encounter (HOSPITAL_COMMUNITY): Payer: Self-pay

## 2021-10-13 ENCOUNTER — Other Ambulatory Visit: Payer: Self-pay

## 2021-10-13 ENCOUNTER — Emergency Department (HOSPITAL_COMMUNITY): Payer: 59

## 2021-10-13 ENCOUNTER — Emergency Department (HOSPITAL_COMMUNITY)
Admission: EM | Admit: 2021-10-13 | Discharge: 2021-10-13 | Disposition: A | Discharge status: Home / Self Care | Payer: 59 | Attending: Emergency Medicine | Admitting: Emergency Medicine

## 2021-10-13 DIAGNOSIS — M62830 Muscle spasm of back: Secondary | ICD-10-CM

## 2021-10-13 LAB — CBC WITH DIFFERENTIAL/PLATELET
Abs Immature Granulocytes: 0.01 10*3/uL (ref 0.00–0.07)
Basophils Absolute: 0 10*3/uL (ref 0.0–0.1)
Basophils Relative: 1 %
Eosinophils Absolute: 0 10*3/uL (ref 0.0–0.5)
Eosinophils Relative: 1 %
HCT: 39.5 % (ref 36.0–46.0)
Hemoglobin: 13.6 g/dL (ref 12.0–15.0)
Immature Granulocytes: 0 %
Lymphocytes Relative: 43 %
Lymphs Abs: 1.9 10*3/uL (ref 0.7–4.0)
MCH: 28 pg (ref 26.0–34.0)
MCHC: 34.4 g/dL (ref 30.0–36.0)
MCV: 81.3 fL (ref 80.0–100.0)
Monocytes Absolute: 0.2 10*3/uL (ref 0.1–1.0)
Monocytes Relative: 5 %
Neutro Abs: 2.2 10*3/uL (ref 1.7–7.7)
Neutrophils Relative %: 50 %
Platelets: 289 10*3/uL (ref 150–400)
RBC: 4.86 MIL/uL (ref 3.87–5.11)
RDW: 13.8 % (ref 11.5–15.5)
WBC: 4.5 10*3/uL (ref 4.0–10.5)
nRBC: 0 % (ref 0.0–0.2)

## 2021-10-13 LAB — COMPREHENSIVE METABOLIC PANEL
ALT: 17 U/L (ref 0–44)
AST: 23 U/L (ref 15–41)
Albumin: 4.2 g/dL (ref 3.5–5.0)
Alkaline Phosphatase: 51 U/L (ref 38–126)
Anion gap: 9 (ref 5–15)
BUN: 11 mg/dL (ref 6–20)
CO2: 22 mmol/L (ref 22–32)
Calcium: 9.5 mg/dL (ref 8.9–10.3)
Chloride: 106 mmol/L (ref 98–111)
Creatinine, Ser: 0.87 mg/dL (ref 0.44–1.00)
GFR, Estimated: >60 mL/min (ref >60)
Glucose, Bld: 92 mg/dL (ref 70–99)
Potassium: 4 mmol/L (ref 3.5–5.1)
Sodium: 137 mmol/L (ref 135–145)
Total Bilirubin: 2.4 mg/dL — ABNORMAL HIGH (ref 0.3–1.2)
Total Protein: 7.3 g/dL (ref 6.5–8.1)

## 2021-10-13 LAB — LIPASE, BLOOD: Lipase: 23 U/L (ref 11–51)

## 2021-10-13 LAB — I-STAT BETA HCG BLOOD, ED (MC, WL, AP ONLY): I-stat hCG, quantitative: <5 m[IU]/mL (ref <5)

## 2021-10-13 MED ORDER — METHOCARBAMOL 500 MG PO TABS: 500.0000 mg | ORAL_TABLET | Freq: Two times a day (BID) | ORAL | 0 refills | Status: AC

## 2021-10-13 MED ORDER — LIDOCAINE 5 % EX PTCH
1.0000 | MEDICATED_PATCH | CUTANEOUS | Status: DC
  Administered 2021-10-13: 1 via TRANSDERMAL
  Filled 2021-10-13: qty 1

## 2021-10-13 MED ORDER — LIDOCAINE 5 % EX PTCH: 1.0000 | MEDICATED_PATCH | CUTANEOUS | 0 refills | Status: AC

## 2021-10-13 MED ORDER — KETOROLAC TROMETHAMINE 15 MG/ML IJ SOLN
15.0000 mg | Freq: Once | INTRAMUSCULAR | Status: AC
  Administered 2021-10-13: 15 mg via INTRAMUSCULAR
  Filled 2021-10-13: qty 1

## 2021-10-13 MED ORDER — DICLOFENAC SODIUM 50 MG PO TBEC: 50.0000 mg | DELAYED_RELEASE_TABLET | Freq: Two times a day (BID) | ORAL | 0 refills | Status: AC

## 2021-10-13 NOTE — Discharge Instructions (Addendum)
Follow up with primary care, may need referral to physical therapy. Warm compresses to sore muscles for 20 minutes at a time. Robaxin and Diclofenac as directed for pain and spasm. Do not drive while taking Robaxin as it may make you sleepy.  Your bilirubin is elevated today. Follow up with your doctor for recheck. Discontinue Flexeril (other muscle relaxor you are taking) as this can elevate your bili. Also limit or discontinue alcohol use. Avoid Tylenol.   Review back exercises, avoid any that cause significant pain. Consider use of TENs type unit which may be available from online retailer.

## 2021-10-13 NOTE — ED Triage Notes (Signed)
Pt c/o mid to right upper back pain x 5 days. Pain has worsened and woke pt from sleep. Pt took advil, muscle relaxers and topical cream w/some relief. Pt has had spasm that worsened pain. Pt has had shortness of breath. Pt denies injury.
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
## 2021-10-13 NOTE — ED Provider Triage Note (Signed)
Emergency Medicine Provider Triage Evaluation Note  Michelle [name] , a 41 y.o. female  was evaluated in triage.  Pt complains of right flank pain.  Pain has been ongoing for 1 week.  It comes in waves of severe 10 out of 10 spasm-like sensations.  She has never had the symptoms before.  She denies any recent injury that could be inciting this.  She says when she has a spasm she feels some type of sensation in her right shoulder that travels down her right arm.  Denies any urinary symptoms, abdominal pain, fever, chills.   Review of Systems  Positive: Right flank pain Negative:   Physical Exam  BP 125/79 (BP Location: Left Arm)   Pulse 76   Temp 98 F (36.7 C) (Oral)   Resp 20   SpO2 100%  Gen:   Awake, no distress   Resp:  Normal effort  MSK:   Moves extremities without difficulty  Other:  Patient appears very uncomfortable.  She is having trouble settling in the room. There is reproducible right CVA tenderness.  She also had some right upper quadrant tenderness to deep palpation, but patient states that she feels this more in the back.  Medical Decision Making  Medically screening exam initiated at 10:11 AM.  Appropriate orders placed.  Payslie S Pry was informed that the remainder of the evaluation will be completed by another provider, this initial triage assessment does not replace that evaluation, and the importance of remaining in the ED until their evaluation is complete.     Claudie Leach, PA-C 10/13/21 1012
